# Patient Record
Sex: Female | Born: 1990
Health system: Southern US, Community
[De-identification: ages and names within clinical notes are randomized; demographics above are authoritative.]

## PROBLEM LIST (undated history)

## (undated) DIAGNOSIS — O009 Unspecified ectopic pregnancy without intrauterine pregnancy: Secondary | ICD-10-CM

## (undated) DIAGNOSIS — T7840XA Allergy, unspecified, initial encounter: Secondary | ICD-10-CM

## (undated) DIAGNOSIS — F32A Depression, unspecified: Secondary | ICD-10-CM

## (undated) DIAGNOSIS — F419 Anxiety disorder, unspecified: Secondary | ICD-10-CM

## (undated) DIAGNOSIS — A609 Anogenital herpesviral infection, unspecified: Secondary | ICD-10-CM

## (undated) DIAGNOSIS — N390 Urinary tract infection, site not specified: Secondary | ICD-10-CM

## (undated) DIAGNOSIS — F329 Major depressive disorder, single episode, unspecified: Secondary | ICD-10-CM

## (undated) HISTORY — DX: Anogenital herpesviral infection, unspecified: A60.9

## (undated) HISTORY — DX: Major depressive disorder, single episode, unspecified: F32.9

## (undated) HISTORY — PX: EXTERNAL EAR SURGERY: SHX627

## (undated) HISTORY — DX: Allergy, unspecified, initial encounter: T78.40XA

## (undated) HISTORY — DX: Anxiety disorder, unspecified: F41.9

## (undated) HISTORY — PX: NO PAST SURGERIES: SHX2092

## (undated) HISTORY — DX: Depression, unspecified: F32.A

---

## 2012-05-07 ENCOUNTER — Encounter (HOSPITAL_COMMUNITY): Payer: Self-pay

## 2012-05-07 ENCOUNTER — Inpatient Hospital Stay (HOSPITAL_COMMUNITY)
Admission: AD | Admit: 2012-05-07 | Discharge: 2012-05-07 | Disposition: A | Discharge status: Home / Self Care | Payer: BC Managed Care – PPO | Source: Ambulatory Visit | Attending: Obstetrics and Gynecology | Admitting: Obstetrics and Gynecology

## 2012-05-07 DIAGNOSIS — O00109 Unspecified tubal pregnancy without intrauterine pregnancy: Secondary | ICD-10-CM | POA: Insufficient documentation

## 2012-05-07 DIAGNOSIS — O009 Unspecified ectopic pregnancy without intrauterine pregnancy: Secondary | ICD-10-CM

## 2012-05-07 LAB — COMPREHENSIVE METABOLIC PANEL
Alkaline Phosphatase: 64 U/L (ref 39–117)
BUN: 12 mg/dL (ref 6–23)
Chloride: 102 mEq/L (ref 96–112)
Creatinine, Ser: 0.72 mg/dL (ref 0.50–1.10)
GFR calc Af Amer: >90 mL/min (ref >90)
Glucose, Bld: 88 mg/dL (ref 70–99)
Potassium: 4.2 mEq/L (ref 3.5–5.1)
Total Bilirubin: 0.8 mg/dL (ref 0.3–1.2)
Total Protein: 7.1 g/dL (ref 6.0–8.3)

## 2012-05-07 LAB — CBC
HCT: 43.5 % (ref 36.0–46.0)
Hemoglobin: 14.9 g/dL (ref 12.0–15.0)
MCHC: 34.3 g/dL (ref 30.0–36.0)
MCV: 86.8 fL (ref 78.0–100.0)

## 2012-05-07 LAB — ABO/RH: ABO/RH(D): O POS

## 2012-05-07 LAB — HCG, QUANTITATIVE, PREGNANCY: hCG, Beta Chain, Quant, S: 1557 m[IU]/mL — ABNORMAL HIGH (ref <5)

## 2012-05-07 MED ORDER — METHOTREXATE INJECTION FOR WOMEN'S HOSPITAL
50.0000 mg/m2 | Freq: Once | INTRAMUSCULAR | Status: AC
  Administered 2012-05-07: 90 mg via INTRAMUSCULAR
  Filled 2012-05-07: qty 1.8

## 2012-05-07 NOTE — MAU Note (Signed)
Pt states sent from MD office for L sided ectopic pregnancy. Spotting at present, denies pain. LMP-03/03/2012

## 2012-05-07 NOTE — Discharge Instructions (Signed)
Ectopic Pregnancy An ectopic pregnancy is when the fertilized egg attaches (implants) outside the uterus. Most ectopic pregnancies occur in the fallopian tube. Rarely do ectopic pregnancies occur on the ovary, intestine, pelvis, or cervix. An ectopic pregnancy does not have the ability to develop into a normal, healthy baby.  A ruptured ectopic pregnancy is one in which the fallopian tube gets torn or bursts and results in internal bleeding. Often there is intense abdominal pain, and sometimes, vaginal bleeding. Having an ectopic pregnancy can be a life-threatening experience. If left untreated, this dangerous condition can lead to a blood transfusion, abdominal surgery, or even death. CAUSES  Damage to the fallopian tubes is the suspected cause in most ectopic pregnancies.  RISK FACTORS Depending on your circumstances, the amount of risk of having an ectopic pregnancy will vary. There are 3 categories that may help you identify whether you are potentially at risk. High Risk  You have gone through infertility treatment.   You have had a previous ectopic pregnancy.   You have had previous tubal surgery.   You have had previous surgery to have the fallopian tubes tied (tubal ligation).   You have tubal problems or diseases.   You have been exposed to DES. DES is a medicine that was used until 1971 and had effects on babies whose mothers took the medicine.   You become pregnant while using an intrauterine device (IUD) for birth control.  Moderate Risk  You have a history of infertility.   You have a history of a sexually transmitted infection (STI).   You have a history of pelvic inflammatory disease (PID).   You have scarring from endometriosis.   You have multiple sexual partners.   You smoke.  Low Risk  You have had previous pelvic surgery.   You use vaginal douching.   You became sexually active before 22 years of age.  SYMPTOMS  An ectopic pregnancy should be suspected  in anyone who has missed a period and has abdominal pain or bleeding.  You may experience normal pregnancy symptoms, such as:   Nausea.   Tiredness.   Breast tenderness.   Symptoms that are not normal include:   Pain with intercourse.   Irregular vaginal bleeding or spotting.   Cramping or pain on one side, or in the lower abdomen.   Fast heartbeat.   Passing out while having a bowel movement.   Symptoms of a ruptured ectopic pregnancy and internal bleeding may include:   Sudden, severe pain in the abdomen and pelvis.   Dizziness or fainting.   Pain in the shoulder area.  DIAGNOSIS  Tests that may be performed include:  A pregnancy test.   An ultrasound.   Testing the specific level of pregnancy hormone in the bloodstream.   Taking a sample of uterus tissue (dilation and curettage, D&C).   Surgery to perform a visual exam of the inside of the abdomen using a lighted tube (laparoscopy).  TREATMENT  An injection of methotrexate medicine may be given. This is given if:  The diagnosis is made early.   The fallopian tube has not ruptured.   You are considered to be a good candidate for the medicine.  Usually, pregnancy hormone blood levels are checked after methotrexate treatment. This is to be sure the medicine is effective. It may take 4 to 6 weeks for the pregnancy to be absorbed (though most pregnancies will be absorbed by 3 weeks). Surgical treatment may be needed. A lighted tube (laparoscope) is  used to remove the tubal pregnancy. If severe internal bleeding occurs, a cut (incision) may be made in the lower abdomen (laparotomy), and the ectopic pregnancy is removed. This stops the bleeding. Part of the fallopian tube, or the whole tube, may be removed as well (salpingectomy). After surgery, pregnancy hormone tests may be done to be sure there is no pregnancy tissue left. You may receive a RhoGAM shot if you are Rh negative and the father is Rh positive, or if you do  not know the Rh type of the father. This is to prevent problems with any future pregnancy. SEEK IMMEDIATE MEDICAL CARE IF:  You have any symptoms of an ectopic pregnancy. This is a medical emergency. Document Released: 12/07/2004 Document Revised: 10/19/2011 Document Reviewed: 07/06/2011 [facility] Patient Information 2012 [place], [place].  Methotrexate Treatment for an Ectopic Pregnancy An ectopic pregnancy is when the fertilized egg attaches (implants) outside the uterus. Most ectopic pregnancies occur in the fallopian tube. Rarely do ectopic pregnancies occur on the ovary, intestine, pelvis, or cervix. An ectopic pregnancy does not have the ability to develop into a normal, healthy baby. Having an ectopic pregnancy can be a life-threatening experience. However, if the ectopic pregnancy is found early enough, it can be treated with a medicine. This medicine is called methotrexate. Methotrexate works by stopping the pregnancy from growing. It helps the body absorb the pregnancy tissue over a 2 to 6 week period (though most pregnancies will be absorbed by 3 weeks).  If methotrexate is successful, there is a good chance that the fallopian tube may be saved. Regardless of whether the fallopian tube is saved, a mother who has had an ectopic pregnancy is at a much higher risk of having another ectopic occur in future pregnancies. One serious concern is the potential for the fallopian tube to tear (rupture). If it does, emergency surgery is needed to remove the pregnancy, and methotrexate cannot be used. The ideal patient for methotrexate is a person who is:   Not bleeding internally.   Has no severe or persistent abdominal pain.   Is committed to following through with lab tests and appointments until the ectopic has absorbed.   Is healthy and has normal liver and kidney functions on evaluation.  Methotrexate should not be given to women who:  Are breastfeeding.   Have a normal pregnancy  (intrauterine pregnancy).   Have liver, lung, or kidney disease.   Have blood problems.   Are allergic to methotrexate.   Have peptic ulcers.   Have an ectopic pregnancy larger than 1 inches (3.5 cm) or one that has fetal heartbeats. This is a rule that is followed most of the time (relative contraindication).  BEFORE THE TREATMENT Before giving the medicine:  Liver tests, kidney tests, and a complete blood test are performed.   Blood tests are performed to measure the pregnancy hormone levels and to determine the mother's blood type.   If the woman is Rh negative, and the father is Rh positive or his Rh type is not known, a RhoGAM shot is given.  TREATMENT  There are 2 methods that your caregiver may use to prescribe methotrexate. One method involves a single dose or injection of the medicine. Another method involves a series of doses. This method involves several injections.  AFTER THE TREATMENT Blood tests will be taken for several weeks to check the pregnancy hormone levels. The blood tests are performed until there is no more pregnancy hormone detected in the blood. There is still  a risk of the ectopic pregnancy rupturing while using the methotrexate. There are also side effects of methotrexate, which include:   Nausea and vomiting.   Mouth sores.   Diarrhea.   Rash.   Dizziness.   Increased abdominal pain.   Increased vaginal bleeding or spotting.   Pneumonia.   Failed treatment.   Hair loss. This is rare and reversible.  On very rare occasions, the medicine may affect your blood counts, liver, kidney, bone marrow, or hormone levels. If this happens, your caregiver will want to perform further evaluations. Document Released: 10/24/2001 Document Revised: 10/19/2011 Document Reviewed: 07/06/2011 [facility] Patient Information 2012 [place], [place].

## 2012-05-07 NOTE — MAU Provider Note (Signed)
Jacqueline Damron21 y.o.G1P0 @[redacted]w[redacted]d  by LMP Chief Complaint  Patient presents with  . Ectopic pregnancy    First Provider Initiated Contact with Patient 05/07/12 2052     SUBJECTIVE  HPI: HPI: Jacqueline Brown is a 21 y.o. year old G1P0 female at [redacted]w[redacted]d weeks gestation by LMP who was sent to MAU for suspected left ectopic pregnancy. She was seen in at [facility] today for spotting, pos UPT. US showed left adnexal mass, no IUP per Dr. Dareen Piano. Pt was sent to MAU for quant. Denies abd pain, passage of tissue.    No past medical history on file. No past surgical history on file. History   Social History  . Marital Status: Single    Spouse Name: N/A    Number of Children: N/A  . Years of Education: N/A   Occupational History  . Not on file.   Social History Main Topics  . Smoking status: Not on file  . Smokeless tobacco: Not on file  . Alcohol Use: Not on file  . Drug Use: Not on file  . Sexually Active: Not on file   Other Topics Concern  . Not on file   Social History Narrative  . No narrative on file   No current facility-administered medications on file prior to encounter.   No current outpatient prescriptions on file prior to encounter.   Allergies  Allergen Reactions  . Sulfa Antibiotics Hives    ROS: Pertinent items in HPI  OBJECTIVE Blood pressure 137/88, pulse 107, temperature 99.6 F (37.6 C), temperature source Oral, resp. rate 16, height 5\' 6"  (1.676 m), weight 67.189 kg (148 lb 2 oz), last menstrual period 03/03/2012, SpO2 100.00%.  GENERAL: Well-developed, well-nourished female in no acute distress.  HEENT: Normocephalic, good dentition HEART: normal rate RESP: normal effort ABDOMEN: Soft, nontender EXTREMITIES: Nontender, no edema NEURO: Alert and oriented SPECULUM EXAM: deferred   LAB RESULTS  Results for orders placed during the hospital encounter of 05/07/12 (from the past 24 hour(s))  HCG, QUANTITATIVE, PREGNANCY     Status: Abnormal   Collection Time   05/07/12  7:28 PM      Component Value Range   hCG, Beta Chain, Quant, S 1557 (*) <5 mIU/mL  CBC     Status: Normal   Collection Time   05/07/12  7:28 PM      Component Value Range   WBC 6.7  4.0 - 10.5 K/uL   RBC 5.01  3.87 - 5.11 MIL/uL   Hemoglobin 14.9  12.0 - 15.0 g/dL   HCT 19.1  47.8 - 29.5 %   MCV 86.8  78.0 - 100.0 fL   MCH 29.7  26.0 - 34.0 pg   MCHC 34.3  30.0 - 36.0 g/dL   RDW 62.1  30.8 - 65.7 %   Platelets 263  150 - 400 K/uL  COMPREHENSIVE METABOLIC PANEL     Status: Normal   Collection Time   05/07/12  7:28 PM      Component Value Range   Sodium 140  135 - 145 mEq/L   Potassium 4.2  3.5 - 5.1 mEq/L   Chloride 102  96 - 112 mEq/L   CO2 27  19 - 32 mEq/L   Glucose, Bld 88  70 - 99 mg/dL   BUN 12  6 - 23 mg/dL   Creatinine, Ser 8.46  0.50 - 1.10 mg/dL   Calcium 9.8  8.4 - 96.2 mg/dL   Total Protein 7.1  6.0 - 8.3  g/dL   Albumin 4.2  3.5 - 5.2 g/dL   AST 16  0 - 37 U/L   ALT 19  0 - 35 U/L   Alkaline Phosphatase 64  39 - 117 U/L   Total Bilirubin 0.8  0.3 - 1.2 mg/dL   GFR calc non Af Amer >90  >90 mL/min   GFR calc Af Amer >90  >90 mL/min  ABO/RH     Status: Normal   Collection Time   05/07/12  7:28 PM      Component Value Range   ABO/RH(D) O POS      IMAGING NA  MTX given per consult w/ Dr. Dareen Piano  ASSESSMENT  1. Ectopic pregnancy without intrauterine pregnancy     PLAN D/C home Ectopic precautions D/C folic acid-containing vitamins Follow-up Information    Follow up with [facility] on 05/10/2012. (for bloodwork or as needed if symptoms worsen)    Contact information:   [address] [place] [address] [phone]       and weekly until [place], [place] 05/07/2012 9:05 PM

## 2012-05-07 NOTE — MAU Note (Signed)
Dr. Dareen Piano notified pt in MAU for ectopic pregnancy eval, denies pain, does have spotting, was told she has l sided ectopic. Dr. Dareen Piano states unsure if pt has ectopic pregnancy or not. Bhcg ordered. Will call MD office with results.

## 2012-05-10 ENCOUNTER — Encounter (HOSPITAL_COMMUNITY): Payer: Self-pay

## 2012-05-10 ENCOUNTER — Inpatient Hospital Stay (HOSPITAL_COMMUNITY)
Admission: AD | Admit: 2012-05-10 | Discharge: 2012-05-10 | Disposition: A | Discharge status: Home / Self Care | Payer: BC Managed Care – PPO | Source: Ambulatory Visit | Attending: Obstetrics & Gynecology | Admitting: Obstetrics & Gynecology

## 2012-05-10 DIAGNOSIS — O00109 Unspecified tubal pregnancy without intrauterine pregnancy: Secondary | ICD-10-CM | POA: Insufficient documentation

## 2012-05-10 DIAGNOSIS — O009 Unspecified ectopic pregnancy without intrauterine pregnancy: Secondary | ICD-10-CM

## 2012-05-10 LAB — HCG, QUANTITATIVE, PREGNANCY: hCG, Beta Chain, Quant, S: 1508 m[IU]/mL — ABNORMAL HIGH (ref <5)

## 2012-05-10 NOTE — Discharge Instructions (Signed)
Ectopic Pregnancy  An ectopic pregnancy happens when a fertilized egg grows outside the uterus. A pregnancy cannot live outside of the uterus. This problem often happens in the fallopian tube. It is often caused by damage to the fallopian tube.  If this problem is found early, you may be treated with medicine. If your tube tears or bursts open (ruptures), you will bleed inside. This is an emergency. You will need surgery. Get help right away.   SYMPTOMS  You may have normal pregnancy symptoms at first. These include:   Missing your period.   Feeling sick to your stomach (nauseous).   Being tired.   Having tender breasts.  Then, you may start to have symptoms that are not normal. These include:   Pain with sex (intercourse).   Bleeding from the vagina. This includes light bleeding (spotting).   Belly (abdomen) or lower belly cramping or pain. This may be felt on one side.   A fast heartbeat (pulse).   Passing out (fainting) after going poop (bowel movement).  If your tube tears, you may have symptoms such as:   Really bad pain in the belly or lower belly. This happens suddenly.   Dizziness.   Passing out.   Shoulder pain.  GET HELP RIGHT AWAY IF:   You have any symptoms that are not normal. This is an emergency.  Document Released: 01/26/2009 Document Revised: 10/19/2011 Document Reviewed: 07/06/2011  [employer] Patient Information 2012 [employer].

## 2012-05-10 NOTE — MAU Note (Signed)
Dr Adrian Blackwater in triage with patient to discus labs and plan of care. Will follow up on Monday.

## 2012-05-10 NOTE — MAU Provider Note (Signed)
  History     CSN: [id number]  Arrival date and time: 05/10/12 1830   None     Chief Complaint  Patient presents with  . Follow-up   HPI 21 yo G1P0 at 9.5 weeks who presents for Day 4 labs.   She has cramping, nausea, and malaise.  She denies bleeding, abdominal pain, vomiting.  OB History    Grav Para Term Preterm Abortions TAB SAB Ect Mult Living   1               No past medical history on file.  No past surgical history on file.  No family history on file.  History  Substance Use Topics  . Smoking status: Not on file  . Smokeless tobacco: Not on file  . Alcohol Use: Not on file    Allergies:  Allergies  Allergen Reactions  . Sulfa Antibiotics Hives    No prescriptions prior to admission    ROS Physical Exam   Blood pressure 137/81, pulse 99, temperature 98.6 F (37 C), temperature source Oral, resp. rate 16, last menstrual period 03/03/2012, SpO2 100.00%.  Physical Exam  Constitutional: She is oriented to person, place, and time. She appears well-developed and well-nourished.  Neurological: She is alert and oriented to person, place, and time.  Skin: Skin is warm and dry.  Psychiatric: She has a normal mood and affect. Her behavior is normal. Judgment and thought content normal.   Results for orders placed during the hospital encounter of 05/10/12 (from the past 24 hour(s))  HCG, QUANTITATIVE, PREGNANCY     Status: Abnormal   Collection Time   05/10/12  6:54 PM      Component Value Range   hCG, Beta Chain, Quant, S 1508 (*) <5 mIU/mL     MAU Course  Procedures   Assessment and Plan  1.  Ectopic Pregnancy  Discussed case with Dr Arlyce Dice.  Will have patient return on Day 7 for labs.  Warning signs and symptoms of ruptured ectopic pregnancy given.  Persis Graffius JEHIEL 05/10/2012, 8:23 PM

## 2012-05-10 NOTE — MAU Note (Signed)
Patient to MAU for day 4 BHCG s/p MTX. Patient states she has had a headache and cramping since the day after the injection. No bleeding at this time. Patient states she "just doesn't feel good"

## 2012-05-13 ENCOUNTER — Inpatient Hospital Stay (HOSPITAL_COMMUNITY)
Admission: AD | Admit: 2012-05-13 | Discharge: 2012-05-13 | Disposition: A | Discharge status: Home / Self Care | Payer: BC Managed Care – PPO | Source: Ambulatory Visit | Attending: Obstetrics and Gynecology | Admitting: Obstetrics and Gynecology

## 2012-05-13 ENCOUNTER — Encounter (HOSPITAL_COMMUNITY): Payer: Self-pay | Admitting: Family

## 2012-05-13 DIAGNOSIS — O00109 Unspecified tubal pregnancy without intrauterine pregnancy: Secondary | ICD-10-CM | POA: Insufficient documentation

## 2012-05-13 DIAGNOSIS — O009 Unspecified ectopic pregnancy without intrauterine pregnancy: Secondary | ICD-10-CM

## 2012-05-13 HISTORY — DX: Unspecified ectopic pregnancy without intrauterine pregnancy: O00.90

## 2012-05-13 LAB — CBC WITH DIFFERENTIAL/PLATELET
Basophils Absolute: 0.1 10*3/uL (ref 0.0–0.1)
HCT: 42.5 % (ref 36.0–46.0)
Hemoglobin: 14.4 g/dL (ref 12.0–15.0)
Lymphocytes Relative: 42 % (ref 12–46)
Lymphs Abs: 2.8 10*3/uL (ref 0.7–4.0)
Monocytes Absolute: 0.5 10*3/uL (ref 0.1–1.0)
Monocytes Relative: 7 % (ref 3–12)
Neutro Abs: 3.2 10*3/uL (ref 1.7–7.7)
RBC: 4.88 MIL/uL (ref 3.87–5.11)
WBC: 6.7 10*3/uL (ref 4.0–10.5)

## 2012-05-13 LAB — COMPREHENSIVE METABOLIC PANEL
BUN: 12 mg/dL (ref 6–23)
Calcium: 9.4 mg/dL (ref 8.4–10.5)
GFR calc Af Amer: >90 mL/min (ref >90)
Glucose, Bld: 86 mg/dL (ref 70–99)
Total Protein: 7.3 g/dL (ref 6.0–8.3)

## 2012-05-13 MED ORDER — METHOTREXATE INJECTION FOR WOMEN'S HOSPITAL
50.0000 mg/m2 | Freq: Once | INTRAMUSCULAR | Status: AC
  Administered 2012-05-13: 90 mg via INTRAMUSCULAR
  Filled 2012-05-13: qty 1.8

## 2012-05-13 MED ORDER — OXYCODONE-ACETAMINOPHEN 5-325 MG PO TABS: 1.0000 | ORAL_TABLET | ORAL | Status: DC | PRN

## 2012-05-13 NOTE — Discharge Instructions (Signed)
Ectopic Pregnancy An ectopic pregnancy is when the fertilized egg attaches (implants) outside the uterus. Most ectopic pregnancies occur in the fallopian tube. Rarely do ectopic pregnancies occur on the ovary, intestine, pelvis, or cervix. An ectopic pregnancy does not have the ability to develop into a normal, healthy baby.  A ruptured ectopic pregnancy is one in which the fallopian tube gets torn or bursts and results in internal bleeding. Often there is intense abdominal pain, and sometimes, vaginal bleeding. Having an ectopic pregnancy can be a life-threatening experience. If left untreated, this dangerous condition can lead to a blood transfusion, abdominal surgery, or even death. CAUSES  Damage to the fallopian tubes is the suspected cause in most ectopic pregnancies.  RISK FACTORS Depending on your circumstances, the amount of risk of having an ectopic pregnancy will vary. There are 3 categories that may help you identify whether you are potentially at risk. High Risk  You have gone through infertility treatment.   You have had a previous ectopic pregnancy.   You have had previous tubal surgery.   You have had previous surgery to have the fallopian tubes tied (tubal ligation).   You have tubal problems or diseases.   You have been exposed to DES. DES is a medicine that was used until 1971 and had effects on babies whose mothers took the medicine.   You become pregnant while using an intrauterine device (IUD) for birth control.  Moderate Risk  You have a history of infertility.   You have a history of a sexually transmitted infection (STI).   You have a history of pelvic inflammatory disease (PID).   You have scarring from endometriosis.   You have multiple sexual partners.   You smoke.  Low Risk  You have had previous pelvic surgery.   You use vaginal douching.   You became sexually active before 21 years of age.  SYMPTOMS  An ectopic pregnancy should be suspected  in anyone who has missed a period and has abdominal pain or bleeding.  You may experience normal pregnancy symptoms, such as:   Nausea.   Tiredness.   Breast tenderness.   Symptoms that are not normal include:   Pain with intercourse.   Irregular vaginal bleeding or spotting.   Cramping or pain on one side, or in the lower abdomen.   Fast heartbeat.   Passing out while having a bowel movement.   Symptoms of a ruptured ectopic pregnancy and internal bleeding may include:   Sudden, severe pain in the abdomen and pelvis.   Dizziness or fainting.   Pain in the shoulder area.  DIAGNOSIS  Tests that may be performed include:  A pregnancy test.   An ultrasound.   Testing the specific level of pregnancy hormone in the bloodstream.   Taking a sample of uterus tissue (dilation and curettage, D&C).   Surgery to perform a visual exam of the inside of the abdomen using a lighted tube (laparoscopy).  TREATMENT  An injection of methotrexate medicine may be given. This is given if:  The diagnosis is made early.   The fallopian tube has not ruptured.   You are considered to be a good candidate for the medicine.  Usually, pregnancy hormone blood levels are checked after methotrexate treatment. This is to be sure the medicine is effective. It may take 4 to 6 weeks for the pregnancy to be absorbed (though most pregnancies will be absorbed by 3 weeks). Surgical treatment may be needed. A lighted tube (laparoscope) is   used to remove the tubal pregnancy. If severe internal bleeding occurs, a cut (incision) may be made in the lower abdomen (laparotomy), and the ectopic pregnancy is removed. This stops the bleeding. Part of the fallopian tube, or the whole tube, may be removed as well (salpingectomy). After surgery, pregnancy hormone tests may be done to be sure there is no pregnancy tissue left. You may receive a RhoGAM shot if you are Rh negative and the father is Rh positive, or if you do  not know the Rh type of the father. This is to prevent problems with any future pregnancy. SEEK IMMEDIATE MEDICAL CARE IF:  You have any symptoms of an ectopic pregnancy. This is a medical emergency. Document Released: 12/07/2004 Document Revised: 10/19/2011 Document Reviewed: 07/06/2011 [employer] Patient Information 2012 [employer]. 

## 2012-05-13 NOTE — MAU Provider Note (Signed)
History     CSN: [id number]  Arrival date and time: 05/13/12 1814   None     Chief Complaint  Patient presents with  . Follow-up   HPI  Pt here for BHCG on day 7 after receiving MTX on 05/07/12.  Pt reports spotting, minimal pain.    Past Medical History  Diagnosis Date  . No pertinent past medical history     Past Surgical History  Procedure Date  . No past surgeries     Family History  Problem Relation Age of Onset  . Heart disease Father     History  Substance Use Topics  . Smoking status: Never Smoker   . Smokeless tobacco: Not on file  . Alcohol Use: Yes     socially     Allergies:  Allergies  Allergen Reactions  . Sulfa Antibiotics Hives    No prescriptions prior to admission    Review of Systems  Gastrointestinal: Positive for abdominal pain (cramping).  Genitourinary:       Spotting of blood  All other systems reviewed and are negative.    Physical Exam   Blood pressure 132/78, pulse 68, temperature 98.5 F (36.9 C), temperature source Oral, resp. rate 16, last menstrual period 03/03/2012, SpO2 100.00%.  Physical Exam  Constitutional: She is oriented to person, place, and time. She appears well-developed and well-nourished. No distress.  HENT:  Head: Normocephalic.  Neck: Normal range of motion. Neck supple.  Neurological: She is alert and oriented to person, place, and time. She has normal reflexes.  Skin: Skin is warm and dry.    MAU Course  Procedures  Results for Jacqueline, Brown (MRN [id number]) as of 05/13/2012 20:39  Ref. Range 05/07/2012 19:28 05/08/2012 02:09 05/10/2012 18:54 05/11/2012 02:10 05/13/2012 18:29  hCG, Beta Chain, Quant, S Latest Range: <5 mIU/mL 1557 (H)  1508 (H)  1232 (H)   Only 21% drop in BHCG   Consulted with Dr. Dareen Piano, since less than 25% drop repeat MTX  CMP and CBC ordered  Report given to M. Mayford Knife who assumes care of patient.  [facility]   Assessment and Plan   Results for orders placed  during the hospital encounter of 05/13/12 (from the past 24 hour(s))  HCG, QUANTITATIVE, PREGNANCY     Status: Abnormal   Collection Time   05/13/12  6:29 PM      Component Value Range   hCG, Beta Chain, Quant, S 1232 (*) <5 mIU/mL  COMPREHENSIVE METABOLIC PANEL     Status: Normal   Collection Time   05/13/12  6:29 PM      Component Value Range   Sodium 136  135 - 145 mEq/L   Potassium 3.9  3.5 - 5.1 mEq/L   Chloride 97  96 - 112 mEq/L   CO2 25  19 - 32 mEq/L   Glucose, Bld 86  70 - 99 mg/dL   BUN 12  6 - 23 mg/dL   Creatinine, Ser 0.86  0.50 - 1.10 mg/dL   Calcium 9.4  8.4 - 57.8 mg/dL   Total Protein 7.3  6.0 - 8.3 g/dL   Albumin 4.2  3.5 - 5.2 g/dL   AST 18  0 - 37 U/L   ALT 19  0 - 35 U/L   Alkaline Phosphatase 60  39 - 117 U/L   Total Bilirubin 1.0  0.3 - 1.2 mg/dL   GFR calc non Af Amer >90  >90 mL/min   GFR calc Af Amer >90  >  90 mL/min  CBC WITH DIFFERENTIAL     Status: Normal   Collection Time   05/13/12  6:29 PM      Component Value Range   WBC 6.7  4.0 - 10.5 K/uL   RBC 4.88  3.87 - 5.11 MIL/uL   Hemoglobin 14.4  12.0 - 15.0 g/dL   HCT 40.9  81.1 - 91.4 %   MCV 87.1  78.0 - 100.0 fL   MCH 29.5  26.0 - 34.0 pg   MCHC 33.9  30.0 - 36.0 g/dL   RDW 78.2  95.6 - 21.3 %   Platelets 266  150 - 400 K/uL   Neutrophils Relative 48  43 - 77 %   Neutro Abs 3.2  1.7 - 7.7 K/uL   Lymphocytes Relative 42  12 - 46 %   Lymphs Abs 2.8  0.7 - 4.0 K/uL   Monocytes Relative 7  3 - 12 %   Monocytes Absolute 0.5  0.1 - 1.0 K/uL   Eosinophils Relative 2  0 - 5 %   Eosinophils Absolute 0.1  0.0 - 0.7 K/uL   Basophils Relative 1  0 - 1 %   Basophils Absolute 0.1  0.0 - 0.1 K/uL    Methotrexate ordered according to protocol. Pt discharged home. Plan repeat quant

## 2012-05-13 NOTE — MAU Note (Signed)
Labs discussed with pt per [facility].

## 2012-05-13 NOTE — MAU Note (Signed)
Patient to MAU for day 7 BHCG s/p MTX injection. Patient states she is having a little spotting, cramping and a headache that she has had since getting the injection.

## 2012-05-13 NOTE — MAU Note (Signed)
At 2235, call to pharmacy to check on status of MTX and pharmacy states there is no order. Pt was notified of labs and need for MTX at [facility]. Williams,CNM notified of same.

## 2012-05-15 ENCOUNTER — Observation Stay (HOSPITAL_COMMUNITY)
Admission: AD | Admit: 2012-05-15 | Discharge: 2012-05-16 | Disposition: A | Discharge status: Home / Self Care | Payer: BC Managed Care – PPO | Source: Ambulatory Visit | Attending: Obstetrics & Gynecology | Admitting: Obstetrics & Gynecology

## 2012-05-15 ENCOUNTER — Encounter (HOSPITAL_COMMUNITY): Payer: Self-pay | Admitting: Family

## 2012-05-15 ENCOUNTER — Inpatient Hospital Stay (HOSPITAL_COMMUNITY): Payer: BC Managed Care – PPO

## 2012-05-15 DIAGNOSIS — O00109 Unspecified tubal pregnancy without intrauterine pregnancy: Principal | ICD-10-CM | POA: Insufficient documentation

## 2012-05-15 DIAGNOSIS — O009 Unspecified ectopic pregnancy without intrauterine pregnancy: Secondary | ICD-10-CM

## 2012-05-15 DIAGNOSIS — N939 Abnormal uterine and vaginal bleeding, unspecified: Secondary | ICD-10-CM

## 2012-05-15 DIAGNOSIS — R102 Pelvic and perineal pain: Secondary | ICD-10-CM

## 2012-05-15 LAB — CBC
Hemoglobin: 13.3 g/dL (ref 12.0–15.0)
MCHC: 34 g/dL (ref 30.0–36.0)
RBC: 4.46 MIL/uL (ref 3.87–5.11)

## 2012-05-15 LAB — TYPE AND SCREEN: Antibody Screen: NEGATIVE

## 2012-05-15 MED ORDER — SODIUM CHLORIDE 0.9 % IJ SOLN: 3.0000 mL | Freq: Two times a day (BID) | INTRAMUSCULAR | Status: DC

## 2012-05-15 MED ORDER — IBUPROFEN 600 MG PO TABS
600.0000 mg | ORAL_TABLET | Freq: Four times a day (QID) | ORAL | Status: DC | PRN
  Administered 2012-05-16: 600 mg via ORAL
  Filled 2012-05-15: qty 1

## 2012-05-15 MED ORDER — HYDROCODONE-ACETAMINOPHEN 5-325 MG PO TABS
1.0000 | ORAL_TABLET | Freq: Once | ORAL | Status: AC
  Administered 2012-05-15: 1 via ORAL
  Filled 2012-05-15: qty 1

## 2012-05-15 MED ORDER — PRENATAL MULTIVITAMIN CH
1.0000 | ORAL_TABLET | Freq: Every day | ORAL | Status: DC
  Filled 2012-05-15: qty 1

## 2012-05-15 MED ORDER — SODIUM CHLORIDE 0.9 % IJ SOLN: 3.0000 mL | INTRAMUSCULAR | Status: DC | PRN

## 2012-05-15 MED ORDER — HYDROCODONE-ACETAMINOPHEN 5-325 MG PO TABS
1.0000 | ORAL_TABLET | ORAL | Status: DC | PRN
  Administered 2012-05-16 (×2): 2 via ORAL
  Filled 2012-05-15 (×2): qty 2

## 2012-05-15 MED ORDER — SODIUM CHLORIDE 0.9 % IV SOLN
250.0000 mL | INTRAVENOUS | Status: DC | PRN
  Administered 2012-05-15: 250 mL via INTRAVENOUS

## 2012-05-15 NOTE — MAU Note (Signed)
Patient has known left ectopic pregnancy and has had MTX x 2, last dose on 7-1. Had sudden onset of lower abdominal pain that radiated to the back. No bleeding. States pain continues. Arrived by EMS with a 18 angio in the L antecubital.No bleeding

## 2012-05-15 NOTE — H&P (Signed)
Ms. Jacqueline Brown is a 21 year old, G1P0, patient of Dr. Delray Alt who is currently undergoing MTX therapy for an ectopic pregnancy. Pt received her first dose on 05/07/12. At the time her [facility] was 1,557. She had a left adnexal mass approximately 2cm.  On 6/28 her Quant was 1,508. On 7/1 her Quant was 1,232.  Because the drop was not enough she received a second dose of MTX on 7/1.  She states that she had acute onset of LAP tonight and called EMS. When she arrived she had a follow up ultrasound which revealed a 2.9cm Left adnexal mass and a 1.0 cm area c/w a clot.  Her HGB was stable from two days ago and her Sharene Butters was now 45.  PE: VSSAF        ABD- soft, some guarding in both lower quads, no rebound, no masses.  IMP/  Pt is not in acute distress or pain. Her HGB is stable, the Quant BHCG is decreasing and the mass is stable.          I do not feel that the patient needs surgery at this time. Plan/ Will admit until the am. Repeat the CBC in the am and if stable will discharge.

## 2012-05-15 NOTE — MAU Provider Note (Signed)
History     CSN: [id number]  Arrival date and time: 05/15/12 1834   None     Chief Complaint  Patient presents with  . Abdominal Pain   HPI Jacqueline Brown is 21 y.o. G1P0 [redacted]w[redacted]d weeks presenting with sudden onset of abdominal pain while sitting. Patient of  Dr. Claiborne Billings.   She has a known left ectopic pregnancy that has been treated with 2 doses of MTX.   Has had nausea, treated with Phenergan.  Denies vaginal bleeding today.  Brought in to MAU by EMS.    Past Medical History  Diagnosis Date  . No pertinent past medical history     Past Surgical History  Procedure Date  . No past surgeries     Family History  Problem Relation Age of Onset  . Heart disease Father     History  Substance Use Topics  . Smoking status: Never Smoker   . Smokeless tobacco: Not on file  . Alcohol Use: Yes     socially     Allergies:  Allergies  Allergen Reactions  . Sulfa Antibiotics Hives    Prescriptions prior to admission  Medication Sig Dispense Refill  . diphenhydrAMINE (SOMINEX) 25 MG tablet Take 25 mg by mouth at bedtime as needed. For rash/sleep      . oxyCODONE-acetaminophen (ROXICET) 5-325 MG per tablet Take 1 tablet by mouth every 4 (four) hours as needed for pain.  30 tablet  0    Review of Systems  Constitutional: Negative for fever and chills.  Gastrointestinal: Positive for nausea and abdominal pain.  Genitourinary:       Negative for vaginal bleeding   Physical Exam   Blood pressure 119/62, pulse 83, temperature 98.1 F (36.7 C), temperature source Oral, resp. rate 20, height 5\' 6"  (1.676 m), weight 67.132 kg (148 lb), last menstrual period 03/03/2012, SpO2 100.00%.  Physical Exam  Constitutional: She is oriented to person, place, and time. She appears well-developed and well-nourished. No distress.       smiling  HENT:  Head: Normocephalic.  Neck: Normal range of motion.  Cardiovascular: Normal rate.   Respiratory: Effort normal.  Neurological: She is alert  and oriented to person, place, and time.  Skin: Skin is warm and dry.  Psychiatric: She has a normal mood and affect. Her behavior is normal. Thought content normal.   Results for orders placed during the hospital encounter of 05/15/12 (from the past 24 hour(s))  HCG, QUANTITATIVE, PREGNANCY     Status: Abnormal   Collection Time   05/15/12  7:23 PM      Component Value Range   hCG, Beta Chain, Quant, S 767 (*) <5 mIU/mL   MAU Course  Procedures  MDM Orders for ultrasound and BHCG in.  Dr. Dareen Piano is aware of patient's admission to MAU. 20:00 Care turned over to T. Marvetta Gibbons, NP  2015 - Client came due to pain all across lower abdomen and in low back.  Worse than when she was here earlier having quants checked.  Describes as 6/10 currently.  Was 10/10 when she arrived.  Asking for pain medication.  Exam.  Tender to palpation in RLQ and LLQ with L>R.  Mild guarding noted.  Having some vaginal bleeding now and was not having bleeding previously.  2030  Dr. Dareen Piano notified of ultrasound results.  2120  Dr. Dareen Piano in to see client.  Discussed plan.  Will admit tonight.  Reviewed HGB results.  Assessment and Plan  Ectopic pregnancy Abdominal  and back pain Vaginal bleeding  Plan Admit and observe  KEY,EVE M 05/15/2012, 7:19 PM

## 2012-05-16 LAB — CBC
HCT: 37 % (ref 36.0–46.0)
Hemoglobin: 12.3 g/dL (ref 12.0–15.0)
MCH: 29.2 pg (ref 26.0–34.0)
MCHC: 33.2 g/dL (ref 30.0–36.0)
MCV: 87.9 fL (ref 78.0–100.0)
Platelets: 208 10*3/uL (ref 150–400)
RBC: 4.21 MIL/uL (ref 3.87–5.11)
RDW: 13 % (ref 11.5–15.5)
WBC: 5.7 10*3/uL (ref 4.0–10.5)

## 2012-05-16 MED ORDER — HYDROCODONE-ACETAMINOPHEN 5-325 MG PO TABS: 1.0000 | ORAL_TABLET | ORAL | Status: DC | PRN

## 2012-05-16 NOTE — Progress Notes (Signed)
Patient is eating, ambulating, and voiding.  Pain is much better today. HGB is stable.    BP 94/57  Pulse 63  Temp 97.7 F (36.5 C) (Oral)  Resp 18  Ht 5\' 6"  (1.676 m)  Wt 67.132 kg (148 lb)  BMI 23.89 kg/m2  SpO2 99%  LMP 03/03/2012  lungs:   clear to auscultation cor:    RRR Abdomen:  soft, appropriate tenderness, incisions intact and without erythema or exudate. ex:    no cords   Lab Results  Component Value Date   WBC 5.7 05/16/2012   HGB 12.3 05/16/2012   HCT 37.0 05/16/2012   MCV 87.9 05/16/2012   PLT 208 05/16/2012    A/P  Ectopic, S/p methotrexate, second dose on 07-01.  Expect d/c per plan.  Recheck HCG tomorrow.  Vicodin for pain. Nancye Grumbine A

## 2012-05-16 NOTE — Progress Notes (Signed)
UR Chart review completed.  

## 2012-05-16 NOTE — Progress Notes (Signed)
Pt discharged to home with friend.  Condition stable.  Pt ambulated to car with E. Pinion, NT.  No equipment for home ordered at discharge. 

## 2012-05-16 NOTE — Discharge Summary (Signed)
Physician Discharge Summary  Patient ID: Jacqueline Brown MRN: [id number] DOB/AGE: June 13, 1991 20 y.o.  Admit date: 05/15/2012 Discharge date: 05/16/2012  Admission Diagnoses:ectopic, increased pain  Discharge Diagnoses: same, stable post methotrexate Active Problems:  * No active hospital problems. *    Discharged Condition: good  Hospital Course: Hgb stable  Consults: None  Significant Diagnostic Studies: labs:  CBC    Component Value Date/Time   WBC 5.7 05/16/2012 0605   RBC 4.21 05/16/2012 0605   HGB 12.3 05/16/2012 0605   HCT 37.0 05/16/2012 0605   PLT 208 05/16/2012 0605   MCV 87.9 05/16/2012 0605   MCH 29.2 05/16/2012 0605   MCHC 33.2 05/16/2012 0605   RDW 13.0 05/16/2012 0605   LYMPHSABS 2.8 05/13/2012 1829   MONOABS 0.5 05/13/2012 1829   EOSABS 0.1 05/13/2012 1829   BASOSABS 0.1 05/13/2012 1829      Treatments: IV hydration   Disposition: 01-Home or Self Care   Medication List  As of 05/16/2012  9:21 AM   STOP taking these medications         oxyCODONE-acetaminophen 5-325 MG per tablet         TAKE these medications         diphenhydrAMINE 25 MG tablet   Commonly known as: SOMINEX   Take 25 mg by mouth at bedtime as needed. For rash/sleep      HYDROcodone-acetaminophen 5-325 MG per tablet   Commonly known as: NORCO   Take 1-2 tablets by mouth every 4 (four) hours as needed.           Follow-up Information    Follow up with [facility]. (for repeat hcg)    Contact information:   [address] [place] [address]          Signed: Loney Laurence 05/16/2012, 9:21 AM

## 2012-05-17 ENCOUNTER — Inpatient Hospital Stay (HOSPITAL_COMMUNITY)
Admission: AD | Admit: 2012-05-17 | Discharge: 2012-05-17 | Disposition: A | Discharge status: Home / Self Care | Payer: BC Managed Care – PPO | Source: Ambulatory Visit | Attending: Obstetrics and Gynecology | Admitting: Obstetrics and Gynecology

## 2012-05-17 DIAGNOSIS — O00109 Unspecified tubal pregnancy without intrauterine pregnancy: Secondary | ICD-10-CM | POA: Insufficient documentation

## 2012-05-17 DIAGNOSIS — Z09 Encounter for follow-up examination after completed treatment for conditions other than malignant neoplasm: Secondary | ICD-10-CM

## 2012-05-17 LAB — HCG, QUANTITATIVE, PREGNANCY: hCG, Beta Chain, Quant, S: 294 m[IU]/mL — ABNORMAL HIGH (ref <5)

## 2012-05-17 NOTE — MAU Provider Note (Signed)
Jacqueline Brown is a 21 y.o. female who presents to MAU for follow up Bhcg s/p second MTX for left ectopic pregnancy. On 05/10/12 the bhcg was 1503, then on 05/13/12 dropped to 1232. On 7/3 it was 767 and today has dropped to 294. Patient with minimal pain today.   BP 114/75  Pulse 92  Temp 98.5 F (36.9 C) (Oral)  Resp 16  SpO2 100%  LMP 03/03/2012   I discussed the results with Dr. Henderson Cloud.   Patient will return on Monday 05/20/12 for Bhcg. She will continue ectopic precautions.

## 2012-05-17 NOTE — MAU Note (Signed)
Patient to MAU for day #4 BHCG s/p 2nd MTX injection. Patient state she continues to have a little pain but no bleeding.

## 2012-05-20 ENCOUNTER — Inpatient Hospital Stay (HOSPITAL_COMMUNITY)
Admission: AD | Admit: 2012-05-20 | Discharge: 2012-05-20 | Disposition: A | Discharge status: Hospice | Payer: BC Managed Care – PPO | Source: Ambulatory Visit | Attending: Obstetrics and Gynecology | Admitting: Obstetrics and Gynecology

## 2012-05-20 ENCOUNTER — Encounter (HOSPITAL_COMMUNITY): Payer: Self-pay | Admitting: Family

## 2012-05-20 DIAGNOSIS — O00109 Unspecified tubal pregnancy without intrauterine pregnancy: Secondary | ICD-10-CM | POA: Insufficient documentation

## 2012-05-20 DIAGNOSIS — O009 Unspecified ectopic pregnancy without intrauterine pregnancy: Secondary | ICD-10-CM

## 2012-05-20 LAB — HCG, QUANTITATIVE, PREGNANCY: hCG, Beta Chain, Quant, S: 57 m[IU]/mL — ABNORMAL HIGH (ref <5)

## 2012-05-20 NOTE — MAU Note (Signed)
Pt here for f/u BHCG.  Having mild lower left abd pain that she rates a "3" and says she took 1 percocet today for the pain that helped.  Red spotting when she wipes.

## 2012-05-20 NOTE — MAU Provider Note (Signed)
History     CSN: [id number]  Arrival date and time: 05/20/12 1901   None     No chief complaint on file.  HPI  Jacqueline Brown is a 21 year old, G1P0, patient of Dr. Delray Alt who is currently undergoing MTX therapy for an ectopic pregnancy. Pt received her first dose on 05/07/12. At the time her [facility] was 1,557. She had a left adnexal mass approximately 2cm. On 6/28 her Quant was 1,508. On 7/1 her Quant was 1,232. Because the drop was not enough she received a second dose of MTX on 7/1. Pt arrived EMS on 05/15/12 and on follow up ultrasound found a 2.9cm Left adnexal mass and a 1.0 cm area c/w a clot. Her HGB was stable from two days ago and her Sharene Butters was 22. Opted to not do surgery and just repeat BHCG.  BHCG has been the following:    Results for JONAE, RENSHAW (MRN [id number]) as of 05/20/2012 19:55  Ref. Range 05/13/2012 18:29 05/14/2012 02:11 05/15/2012 19:23 05/15/2012 19:51 05/15/2012 20:00 05/16/2012 06:05 05/17/2012 02:10 05/17/2012 13:35 05/18/2012 02:10 05/20/2012 19:37  hCG, Beta Chain, Quant, S Latest Range: <5 mIU/mL 1232 (H)  767 (H)     294 (H)  57 (H)   Pt reports some cramping and scant amount of bleeding.     Past Medical History  Diagnosis Date  . No pertinent past medical history     Past Surgical History  Procedure Date  . No past surgeries     Family History  Problem Relation Age of Onset  . Heart disease Father     History  Substance Use Topics  . Smoking status: Never Smoker   . Smokeless tobacco: Not on file  . Alcohol Use: Yes     socially     Allergies:  Allergies  Allergen Reactions  . Sulfa Antibiotics Hives    Prescriptions prior to admission  Medication Sig Dispense Refill  . diphenhydrAMINE (BENADRYL) 25 MG tablet Take 25 mg by mouth every 6 (six) hours as needed. For allergies, rash or sleep      . HYDROcodone-acetaminophen (NORCO) 5-325 MG per tablet Take 1-2 tablets by mouth every 4 (four) hours as needed. For pain        Review of Systems    Gastrointestinal: Positive for abdominal pain (cramping).  Genitourinary:       Scant bleeding  All other systems reviewed and are negative.   Physical Exam   Blood pressure 115/74, pulse 82, temperature 98.9 F (37.2 C), temperature source Oral, resp. rate 16, last menstrual period 03/03/2012.  Physical Exam  Constitutional: She is oriented to person, place, and time. She appears well-developed and well-nourished. No distress.  HENT:  Head: Normocephalic.  Neck: Normal range of motion. Neck supple.  GI: Soft. There is no tenderness.  Musculoskeletal: Normal range of motion.  Neurological: She is alert and oriented to person, place, and time. She has normal reflexes.  Skin: Skin is warm and dry.    MAU Course  Procedures Results for orders placed during the hospital encounter of 05/20/12 (from the past 24 hour(s))  HCG, QUANTITATIVE, PREGNANCY     Status: Abnormal   Collection Time   05/20/12  7:37 PM      Component Value Range   hCG, Beta Chain, Quant, S 57 (*) <5 mIU/mL     Assessment and Plan  Ectopic Pregnancy s/p Methotrexate  Plan: DC to home Ectopic precautions Return for BHCG in one week  [facility]  05/20/2012, 8:12 PM

## 2012-05-27 ENCOUNTER — Encounter (HOSPITAL_COMMUNITY): Payer: Self-pay | Admitting: *Deleted

## 2012-05-27 ENCOUNTER — Inpatient Hospital Stay (HOSPITAL_COMMUNITY)
Admission: AD | Admit: 2012-05-27 | Discharge: 2012-05-27 | Disposition: A | Discharge status: Home / Self Care | Payer: BC Managed Care – PPO | Source: Ambulatory Visit | Attending: Obstetrics & Gynecology | Admitting: Obstetrics & Gynecology

## 2012-05-27 DIAGNOSIS — O00109 Unspecified tubal pregnancy without intrauterine pregnancy: Secondary | ICD-10-CM | POA: Insufficient documentation

## 2012-05-27 DIAGNOSIS — Z09 Encounter for follow-up examination after completed treatment for conditions other than malignant neoplasm: Secondary | ICD-10-CM

## 2012-05-27 HISTORY — DX: Unspecified ectopic pregnancy without intrauterine pregnancy: O00.90

## 2012-05-27 HISTORY — DX: Urinary tract infection, site not specified: N39.0

## 2012-05-27 NOTE — MAU Note (Signed)
Follow up post methotrexate, received 2 doses.

## 2012-05-27 NOTE — MAU Provider Note (Signed)
Jacqueline Brown is a 21 y.o. female who presents to MAU for follow up Bhcg s/p second MTX. The number has been dropping with each visit. The patient has no pain.  BP 119/68  Pulse 69  Temp 98.6 F (37 C) (Oral)  Resp 18  LMP 03/03/2012   Results for orders placed during the hospital encounter of 05/27/12 (from the past 24 hour(s))  HCG, QUANTITATIVE, PREGNANCY     Status: Normal   Collection Time   05/27/12  6:47 PM      Component Value Range   hCG, Beta Chain, Quant, S 4  <5 mIU/mL   I spoke with Dr. Arlyce Dice and the patient can follow up in 2 weeks in the office. I discussed the results of the Bhcg with the patient and plan of care and she voices understanding.

## 2013-12-23 ENCOUNTER — Ambulatory Visit (INDEPENDENT_AMBULATORY_CARE_PROVIDER_SITE_OTHER): Payer: 59 | Admitting: Physician Assistant

## 2013-12-23 VITALS — BP 124/76 | HR 98 | Temp 98.0°F | Resp 17 | Ht 66.5 in | Wt 164.0 lb

## 2013-12-23 DIAGNOSIS — N390 Urinary tract infection, site not specified: Secondary | ICD-10-CM

## 2013-12-23 DIAGNOSIS — R300 Dysuria: Secondary | ICD-10-CM

## 2013-12-23 LAB — POCT CBC
Granulocyte percent: 48.3 %G (ref 37–80)
HEMATOCRIT: 46.4 % (ref 37.7–47.9)
Hemoglobin: 14.9 g/dL (ref 12.2–16.2)
LYMPH, POC: 3.4 (ref 0.6–3.4)
MCH, POC: 30.8 pg (ref 27–31.2)
MCHC: 32.1 g/dL (ref 31.8–35.4)
MCV: 95.9 fL (ref 80–97)
MID (CBC): 0.4 (ref 0–0.9)
MPV: 8.1 fL (ref 0–99.8)
PLATELET COUNT, POC: 315 10*3/uL (ref 142–424)
POC GRANULOCYTE: 3.6 (ref 2–6.9)
POC LYMPH %: 45.9 % (ref 10–50)
POC MID %: 5.8 %M (ref 0–12)
RBC: 4.84 M/uL (ref 4.04–5.48)
RDW, POC: 13.3 %
WBC: 7.4 10*3/uL (ref 4.6–10.2)

## 2013-12-23 LAB — POCT URINALYSIS DIPSTICK
BILIRUBIN UA: NEGATIVE
GLUCOSE UA: NEGATIVE
KETONES UA: 15
NITRITE UA: POSITIVE
PH UA: 7
Protein, UA: 30
SPEC GRAV UA: 1.02
Urobilinogen, UA: 1

## 2013-12-23 LAB — POCT UA - MICROSCOPIC ONLY
Crystals, Ur, HPF, POC: NEGATIVE
MUCUS UA: NEGATIVE
RBC, URINE, MICROSCOPIC: NEGATIVE
Yeast, UA: NEGATIVE

## 2013-12-23 MED ORDER — FLUCONAZOLE 150 MG PO TABS: 150.0000 mg | ORAL_TABLET | Freq: Once | ORAL | Status: DC

## 2013-12-23 MED ORDER — PHENAZOPYRIDINE HCL 200 MG PO TABS: 200.0000 mg | ORAL_TABLET | Freq: Three times a day (TID) | ORAL | Status: DC | PRN

## 2013-12-23 MED ORDER — NITROFURANTOIN MONOHYD MACRO 100 MG PO CAPS: 100.0000 mg | ORAL_CAPSULE | Freq: Two times a day (BID) | ORAL | Status: AC

## 2013-12-23 NOTE — Progress Notes (Signed)
Subjective:    Patient ID: Jacqueline Brown, female    DOB: 11/25/1990, 23 y.o.   MRN: [id number]  HPI Pt presents to clinic with dysuria and urgency with urinary frequency.  Started yesterday and seems worse today.  Today has back pain mainly on the right side.  She has h/o frequent UTIs but has never had a kidney infection.  She is sexually active but has not had a change in sexual partner recently.   OTC meds - AZO - helps  Review of Systems  Gastrointestinal: Positive for nausea and abdominal pain (pressure).  Genitourinary: Positive for dysuria, urgency and frequency. Negative for vaginal discharge.  Musculoskeletal: Positive for back pain (more on the right side).       Objective:   Physical Exam  Vitals reviewed. Constitutional: She is oriented to person, place, and time. She appears well-developed and well-nourished.  HENT:  Head: Normocephalic and atraumatic.  Right Ear: External ear normal.  Left Ear: External ear normal.  Eyes: Conjunctivae are normal.  Cardiovascular: Normal rate, regular rhythm and normal heart sounds.   No murmur heard. Pulmonary/Chest: Effort normal and breath sounds normal.  Abdominal: Soft. Bowel sounds are normal. There is tenderness (suprapubic TTP) in the suprapubic area. There is no CVA tenderness.  Neurological: She is alert and oriented to person, place, and time.  Skin: Skin is warm and dry.  Psychiatric: She has a normal mood and affect. Her behavior is normal. Judgment and thought content normal.    Results for orders placed in visit on 12/23/13  POCT UA - MICROSCOPIC ONLY      Result Value Range   WBC, Ur, HPF, POC tntc     RBC, urine, microscopic neg     Bacteria, U Microscopic 4++     Mucus, UA neg     Epithelial cells, urine per micros 0-1     Crystals, Ur, HPF, POC neg     Casts, Ur, LPF, POC renal tubular     Yeast, UA neg    POCT URINALYSIS DIPSTICK      Result Value Range   Color, UA orange     Clarity, UA cloudy     Glucose, UA neg     Bilirubin, UA neg     Ketones, UA 15     Spec Grav, UA 1.020     Blood, UA tr-intact     pH, UA 7.0     Protein, UA 30     Urobilinogen, UA 1.0     Nitrite, UA positive     Leukocytes, UA moderate (2+)    POCT CBC      Result Value Range   WBC 7.4  4.6 - 10.2 K/uL   Lymph, poc 3.4  0.6 - 3.4   POC LYMPH PERCENT 45.9  10 - 50 %L   MID (cbc) 0.4  0 - 0.9   POC MID % 5.8  0 - 12 %M   POC Granulocyte 3.6  2 - 6.9   Granulocyte percent 48.3  37 - 80 %G   RBC 4.84  4.04 - 5.48 M/uL   Hemoglobin 14.9  12.2 - 16.2 g/dL   HCT, POC 29.546.4  28.437.7 - 47.9 %   MCV 95.9  80 - 97 fL   MCH, POC 30.8  27 - 31.2 pg   MCHC 32.1  31.8 - 35.4 g/dL   RDW, POC 13.213.3     Platelet Count, POC 315  142 - 424 K/uL  MPV 8.1  0 - 99.8 fL         Assessment & Plan:  Dysuria - Plan: POCT UA - Microscopic Only, POCT urinalysis dipstick  UTI (urinary tract infection) - Plan: POCT CBC, Urine culture, nitrofurantoin, macrocrystal-monohydrate, (MACROBID) 100 MG capsule, phenazopyridine (PYRIDIUM) 200 MG tablet, fluconazole (DIFLUCAN) 150 MG tablet (she has h/o developing yeast infections with abx)  She will push fluids.  Benny Lennert PA-C 12/23/2013 9:23 PM

## 2013-12-26 LAB — URINE CULTURE: Colony Count: >=100000

## 2014-01-09 ENCOUNTER — Other Ambulatory Visit: Payer: Self-pay

## 2014-07-14 ENCOUNTER — Ambulatory Visit (INDEPENDENT_AMBULATORY_CARE_PROVIDER_SITE_OTHER): Payer: 59 | Admitting: Physician Assistant

## 2014-07-14 VITALS — BP 94/56 | HR 114 | Temp 98.7°F | Resp 16 | Ht 66.25 in | Wt 161.8 lb

## 2014-07-14 DIAGNOSIS — J020 Streptococcal pharyngitis: Secondary | ICD-10-CM

## 2014-07-14 DIAGNOSIS — J029 Acute pharyngitis, unspecified: Secondary | ICD-10-CM

## 2014-07-14 LAB — POCT RAPID STREP A (OFFICE): RAPID STREP A SCREEN: POSITIVE — AB

## 2014-07-14 MED ORDER — PENICILLIN G BENZATHINE 1200000 UNIT/2ML IM SUSP
2.4000 10*6.[IU] | Freq: Once | INTRAMUSCULAR | Status: AC
  Administered 2014-07-14: 2.4 10*6.[IU] via INTRAMUSCULAR

## 2014-07-14 NOTE — Progress Notes (Signed)
   Subjective:    Patient ID: Jacqueline Brown, female    DOB: [birth year], 23 y.o.   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  She has some mild congestion but the throat is the worse part.  No fevers but she had has chills.  Review of Systems  Constitutional: Positive for chills. Negative for fever.  HENT: Positive for congestion (mild) and sore throat.        Objective:   Physical Exam  Vitals reviewed. Constitutional: She is oriented to person, place, and time. She appears well-developed and well-nourished.  HENT:  Head: Normocephalic and atraumatic.  Right Ear: External ear normal.  Left Ear: External ear normal.  Eyes: Conjunctivae are normal.  Neck: Normal range of motion.  Cardiovascular: Normal rate, regular rhythm and normal heart sounds.   No murmur heard. Pulmonary/Chest: Effort normal and breath sounds normal. She has no wheezes.  Lymphadenopathy:       Head (right side): No tonsillar and no preauricular adenopathy present.       Head (left side): No tonsillar and no preauricular adenopathy present.    She has cervical adenopathy (L>R and more tender).       Right cervical: Superficial cervical adenopathy present.       Left cervical: Superficial cervical adenopathy present.       Right: No supraclavicular adenopathy present.       Left: No supraclavicular adenopathy present.  Neurological: She is alert and oriented to person, place, and time.  Skin: Skin is warm and dry.  Psychiatric: She has a normal mood and affect. Her behavior is normal. Judgment and thought content normal.   Results for orders placed in visit on 07/14/14  POCT RAPID STREP A (OFFICE)      Result Value Ref Range   Rapid Strep A Screen Positive (*) Negative        Assessment & Plan:  Sore throat - Plan: POCT rapid strep A  Streptococcal sore throat -  Plan: penicillin g benzathine (BICILLIN LA) 1200000 UNIT/2ML injection 2.4 Million Units   Benny Lennert PA-C  [facility] 07/14/2014 12:29 PM

## 2014-08-24 ENCOUNTER — Ambulatory Visit (INDEPENDENT_AMBULATORY_CARE_PROVIDER_SITE_OTHER): Payer: 59

## 2014-08-24 ENCOUNTER — Ambulatory Visit (INDEPENDENT_AMBULATORY_CARE_PROVIDER_SITE_OTHER): Payer: 59 | Admitting: Family Medicine

## 2014-08-24 VITALS — BP 122/74 | HR 90 | Temp 99.0°F | Resp 16 | Ht 67.0 in | Wt 163.0 lb

## 2014-08-24 DIAGNOSIS — S339XXA Sprain of unspecified parts of lumbar spine and pelvis, initial encounter: Secondary | ICD-10-CM

## 2014-08-24 DIAGNOSIS — T148XXA Other injury of unspecified body region, initial encounter: Secondary | ICD-10-CM

## 2014-08-24 DIAGNOSIS — M542 Cervicalgia: Secondary | ICD-10-CM

## 2014-08-24 DIAGNOSIS — T148 Other injury of unspecified body region: Secondary | ICD-10-CM

## 2014-08-24 DIAGNOSIS — S335XXA Sprain of ligaments of lumbar spine, initial encounter: Secondary | ICD-10-CM

## 2014-08-24 MED ORDER — CYCLOBENZAPRINE HCL 5 MG PO TABS: 5.0000 mg | ORAL_TABLET | Freq: Every evening | ORAL | Status: DC | PRN
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
## 2014-08-24 NOTE — Patient Instructions (Signed)
Cervical Strain and Sprain (Whiplash) with Rehab Cervical strain and sprain are injuries that commonly occur with "whiplash" injuries. Whiplash occurs when the neck is forcefully whipped backward or forward, such as during a motor vehicle accident or during contact sports. The muscles, ligaments, tendons, discs, and nerves of the neck are susceptible to injury when this occurs. RISK FACTORS Risk of having a whiplash injury increases if:  Osteoarthritis of the spine.  Situations that make head or neck accidents or trauma more likely.  High-risk sports (football, rugby, wrestling, hockey, auto racing, gymnastics, diving, contact karate, or boxing).  Poor strength and flexibility of the neck.  Previous neck injury.  Poor tackling technique.  Improperly fitted or padded equipment. SYMPTOMS   Pain or stiffness in the front or back of neck or both.  Symptoms may present immediately or up to 24 hours after injury.  Dizziness, headache, nausea, and vomiting.  Muscle spasm with soreness and stiffness in the neck.  Tenderness and swelling at the injury site. PREVENTION  Learn and use proper technique (avoid tackling with the head, spearing, and head-butting; use proper falling techniques to avoid landing on the head).  Warm up and stretch properly before activity.  Maintain physical fitness:  Strength, flexibility, and endurance.  Cardiovascular fitness.  Wear properly fitted and padded protective equipment, such as padded soft collars, for participation in contact sports. PROGNOSIS  Recovery from cervical strain and sprain injuries is dependent on the extent of the injury. These injuries are usually curable in 1 week to 3 months with appropriate treatment.  RELATED COMPLICATIONS   Temporary numbness and weakness may occur if the nerve roots are damaged, and this may persist until the nerve has completely healed.  Chronic pain due to frequent recurrence of  symptoms.  Prolonged healing, especially if activity is resumed too soon (before complete recovery). TREATMENT  Treatment initially involves the use of ice and medication to help reduce pain and inflammation. It is also important to perform strengthening and stretching exercises and modify activities that worsen symptoms so the injury does not get worse. These exercises may be performed at home or with a therapist. For patients who experience severe symptoms, a soft, padded collar may be recommended to be worn around the neck.  Improving your posture may help reduce symptoms. Posture improvement includes pulling your chin and abdomen in while sitting or standing. If you are sitting, sit in a firm chair with your buttocks against the back of the chair. While sleeping, try replacing your pillow with a small towel rolled to 2 inches in diameter, or use a cervical pillow or soft cervical collar. Poor sleeping positions delay healing.  For patients with nerve root damage, which causes numbness or weakness, the use of a cervical traction apparatus may be recommended. Surgery is rarely necessary for these injuries. However, cervical strain and sprains that are present at birth (congenital) may require surgery. MEDICATION   If pain medication is necessary, nonsteroidal anti-inflammatory medications, such as aspirin and ibuprofen, or other minor pain relievers, such as acetaminophen, are often recommended.  Do not take pain medication for 7 days before surgery.  Prescription pain relievers may be given if deemed necessary by your caregiver. Use only as directed and only as much as you need. HEAT AND COLD:   Cold treatment (icing) relieves pain and reduces inflammation. Cold treatment should be applied for 10 to 15 minutes every 2 to 3 hours for inflammation and pain and immediately after any activity that aggravates   your symptoms. Use ice packs or an ice massage.  Heat treatment may be used prior to  performing the stretching and strengthening activities prescribed by your caregiver, physical therapist, or athletic trainer. Use a heat pack or a warm soak. SEEK MEDICAL CARE IF:   Symptoms get worse or do not improve in 2 weeks despite treatment.  New, unexplained symptoms develop (drugs used in treatment may produce side effects). EXERCISES RANGE OF MOTION (ROM) AND STRETCHING EXERCISES - Cervical Strain and Sprain These exercises may help you when beginning to rehabilitate your injury. In order to successfully resolve your symptoms, you must improve your posture. These exercises are designed to help reduce the forward-head and rounded-shoulder posture which contributes to this condition. Your symptoms may resolve with or without further involvement from your physician, physical therapist or athletic trainer. While completing these exercises, remember:   Restoring tissue flexibility helps normal motion to return to the joints. This allows healthier, less painful movement and activity.  An effective stretch should be held for at least 20 seconds, although you may need to begin with shorter hold times for comfort.  A stretch should never be painful. You should only feel a gentle lengthening or release in the stretched tissue. STRETCH- Axial Extensors  Lie on your back on the floor. You may bend your knees for comfort. Place a rolled-up hand towel or dish towel, about 2 inches in diameter, under the part of your head that makes contact with the floor.  Gently tuck your chin, as if trying to make a "double chin," until you feel a gentle stretch at the base of your head.  Hold __________ seconds. Repeat __________ times. Complete this exercise __________ times per day.  STRETCH - Axial Extension   Stand or sit on a firm surface. Assume a good posture: chest up, shoulders drawn back, abdominal muscles slightly tense, knees unlocked (if standing) and feet hip width apart.  Slowly retract your  chin so your head slides back and your chin slightly lowers. Continue to look straight ahead.  You should feel a gentle stretch in the back of your head. Be certain not to feel an aggressive stretch since this can cause headaches later.  Hold for __________ seconds. Repeat __________ times. Complete this exercise __________ times per day. STRETCH - Cervical Side Bend   Stand or sit on a firm surface. Assume a good posture: chest up, shoulders drawn back, abdominal muscles slightly tense, knees unlocked (if standing) and feet hip width apart.  Without letting your nose or shoulders move, slowly tip your right / left ear to your shoulder until your feel a gentle stretch in the muscles on the opposite side of your neck.  Hold __________ seconds. Repeat __________ times. Complete this exercise __________ times per day. STRETCH - Cervical Rotators   Stand or sit on a firm surface. Assume a good posture: chest up, shoulders drawn back, abdominal muscles slightly tense, knees unlocked (if standing) and feet hip width apart.  Keeping your eyes level with the ground, slowly turn your head until you feel a gentle stretch along the back and opposite side of your neck.  Hold __________ seconds. Repeat __________ times. Complete this exercise __________ times per day. RANGE OF MOTION - Neck Circles   Stand or sit on a firm surface. Assume a good posture: chest up, shoulders drawn back, abdominal muscles slightly tense, knees unlocked (if standing) and feet hip width apart.  Gently roll your head down and around from the   back of one shoulder to the back of the other. The motion should never be forced or painful.  Repeat the motion 10-20 times, or until you feel the neck muscles relax and loosen. Repeat __________ times. Complete the exercise __________ times per day. STRENGTHENING EXERCISES - Cervical Strain and Sprain These exercises may help you when beginning to rehabilitate your injury. They may  resolve your symptoms with or without further involvement from your physician, physical therapist, or athletic trainer. While completing these exercises, remember:   Muscles can gain both the endurance and the strength needed for everyday activities through controlled exercises.  Complete these exercises as instructed by your physician, physical therapist, or athletic trainer. Progress the resistance and repetitions only as guided.  You may experience muscle soreness or fatigue, but the pain or discomfort you are trying to eliminate should never worsen during these exercises. If this pain does worsen, stop and make certain you are following the directions exactly. If the pain is still present after adjustments, discontinue the exercise until you can discuss the trouble with your clinician. STRENGTH - Cervical Flexors, Isometric  Face a wall, standing about 6 inches away. Place a small pillow, a ball about 6-8 inches in diameter, or a folded towel between your forehead and the wall.  Slightly tuck your chin and gently push your forehead into the soft object. Push only with mild to moderate intensity, building up tension gradually. Keep your jaw and forehead relaxed.  Hold 10 to 20 seconds. Keep your breathing relaxed.  Release the tension slowly. Relax your neck muscles completely before you start the next repetition. Repeat __________ times. Complete this exercise __________ times per day. STRENGTH- Cervical Lateral Flexors, Isometric   Stand about 6 inches away from a wall. Place a small pillow, a ball about 6-8 inches in diameter, or a folded towel between the side of your head and the wall.  Slightly tuck your chin and gently tilt your head into the soft object. Push only with mild to moderate intensity, building up tension gradually. Keep your jaw and forehead relaxed.  Hold 10 to 20 seconds. Keep your breathing relaxed.  Release the tension slowly. Relax your neck muscles completely  before you start the next repetition. Repeat __________ times. Complete this exercise __________ times per day. STRENGTH - Cervical Extensors, Isometric   Stand about 6 inches away from a wall. Place a small pillow, a ball about 6-8 inches in diameter, or a folded towel between the back of your head and the wall.  Slightly tuck your chin and gently tilt your head back into the soft object. Push only with mild to moderate intensity, building up tension gradually. Keep your jaw and forehead relaxed.  Hold 10 to 20 seconds. Keep your breathing relaxed.  Release the tension slowly. Relax your neck muscles completely before you start the next repetition. Repeat __________ times. Complete this exercise __________ times per day. POSTURE AND BODY MECHANICS CONSIDERATIONS - Cervical Strain and Sprain Keeping correct posture when sitting, standing or completing your activities will reduce the stress put on different body tissues, allowing injured tissues a chance to heal and limiting painful experiences. The following are general guidelines for improved posture. Your physician or physical therapist will provide you with any instructions specific to your needs. While reading these guidelines, remember:  The exercises prescribed by your provider will help you have the flexibility and strength to maintain correct postures.  The correct posture provides the optimal environment for your joints to   work. All of your joints have less wear and tear when properly supported by a spine with good posture. This means you will experience a healthier, less painful body.  Correct posture must be practiced with all of your activities, especially prolonged sitting and standing. Correct posture is as important when doing repetitive low-stress activities (typing) as it is when doing a single heavy-load activity (lifting). PROLONGED STANDING WHILE SLIGHTLY LEANING FORWARD When completing a task that requires you to lean  forward while standing in one place for a long time, place either foot up on a stationary 2- to 4-inch high object to help maintain the best posture. When both feet are on the ground, the low back tends to lose its slight inward curve. If this curve flattens (or becomes too large), then the back and your other joints will experience too much stress, fatigue more quickly, and can cause pain.  RESTING POSITIONS Consider which positions are most painful for you when choosing a resting position. If you have pain with flexion-based activities (sitting, bending, stooping, squatting), choose a position that allows you to rest in a less flexed posture. You would want to avoid curling into a fetal position on your side. If your pain worsens with extension-based activities (prolonged standing, working overhead), avoid resting in an extended position such as sleeping on your stomach. Most people will find more comfort when they rest with their spine in a more neutral position, neither too rounded nor too arched. Lying on a non-sagging bed on your side with a pillow between your knees, or on your back with a pillow under your knees will often provide some relief. Keep in mind, being in any one position for a prolonged period of time, no matter how correct your posture, can still lead to stiffness. WALKING Walk with an upright posture. Your ears, shoulders, and hips should all line up. OFFICE WORK When working at a desk, create an environment that supports good, upright posture. Without extra support, muscles fatigue and lead to excessive strain on joints and other tissues. CHAIR:  A chair should be able to slide under your desk when your back makes contact with the back of the chair. This allows you to work closely.  The chair's height should allow your eyes to be level with the upper part of your monitor and your hands to be slightly lower than your elbows.  Body position:  Your feet should make contact with the  floor. If this is not possible, use a foot rest.  Keep your ears over your shoulders. This will reduce stress on your neck and low back. Document Released: 10/30/2005 Document Revised: 03/16/2014 Document Reviewed: 02/11/2009 [employer] Patient Information 2015 [employer]. This information is not intended to replace advice given to you by your health care provider. Make sure you discuss any questions you have with your health care provider.  

## 2014-09-09 ENCOUNTER — Other Ambulatory Visit: Payer: Self-pay

## 2015-02-23 ENCOUNTER — Telehealth: Payer: Self-pay

## 2015-02-23 NOTE — Telephone Encounter (Signed)
Called patient to let her know that we would be mailing her medical records for DOS 08/24/14 to her address on file.

## 2015-03-27 ENCOUNTER — Emergency Department (INDEPENDENT_AMBULATORY_CARE_PROVIDER_SITE_OTHER)
Admission: EM | Admit: 2015-03-27 | Discharge: 2015-03-27 | Disposition: A | Discharge status: Home / Self Care | Payer: Self-pay | Source: Home / Self Care | Attending: Emergency Medicine | Admitting: Emergency Medicine

## 2015-03-27 DIAGNOSIS — J039 Acute tonsillitis, unspecified: Secondary | ICD-10-CM

## 2015-03-27 LAB — POCT RAPID STREP A: Streptococcus, Group A Screen (Direct): NEGATIVE

## 2015-03-27 MED ORDER — CLINDAMYCIN HCL 150 MG PO CAPS: 150.0000 mg | ORAL_CAPSULE | Freq: Three times a day (TID) | ORAL | Status: DC

## 2015-03-27 NOTE — ED Provider Notes (Signed)
CSN: [id number]     Arrival date & time 03/27/15  1745 History   First MD Initiated Contact with Patient 03/27/15 1810     Chief Complaint  Patient presents with  . Sore Throat   (Consider location/radiation/quality/duration/timing/severity/associated sxs/prior Treatment) HPI  She is a 24 year old woman here for evaluation of sore throat. She states this started about 3 days ago with a mild sore throat and a mild headache. This morning, her symptoms acutely worsened with an episode of skin sensitivity, worsening headache, and worsening sore throat. She is able to eat and drink, but does not have an appetite. She denies nausea or vomiting. No nasal congestion or rhinorrhea. She reports a very mild cough. No documented fever. She has been taking ibuprofen with some improvement.  Past Medical History  Diagnosis Date  . Ectopic pregnancy July 2013    methotrexate x2  . Urinary tract infection   . Allergy   . Depression    Past Surgical History  Procedure Laterality Date  . No past surgeries     Family History  Problem Relation Age of Onset  . Heart disease Father   . Other Neg Hx    History  Substance Use Topics  . Smoking status: Never Smoker   . Smokeless tobacco: Never Used  . Alcohol Use: Yes     Comment: socially    OB History    Gravida Para Term Preterm AB TAB SAB Ectopic Multiple Living   1    0   0       Review of Systems As in history of present illness Allergies  Sulfa antibiotics  Home Medications   Prior to Admission medications   Medication Sig Start Date End Date Taking? Authorizing Provider  clindamycin (CLEOCIN) 150 MG capsule Take 1 capsule (150 mg total) by mouth 3 (three) times daily. 03/27/15   Charm RingsErin J Rosendo Couser, MD  cyclobenzaprine (FLEXERIL) 5 MG tablet Take 1 tablet (5 mg total) by mouth at bedtime as needed. 08/24/14   Thao P Le, DO  norethindrone-ethinyl estradiol (OVCON-50) 1-50 MG-MCG tablet Take 1 tablet by mouth daily.    Historical Provider, MD    BP 117/76 mmHg  Pulse 94  Temp(Src) 98.7 F (37.1 C) (Oral)  Resp 16  SpO2 96%  LMP 03/24/2015 Physical Exam  Constitutional: She is oriented to person, place, and time. She appears well-developed and well-nourished. No distress.  HENT:  Nose: Nose normal.  Mouth/Throat: Oropharyngeal exudate present.  Tonsils are erythematous and enlarged  Eyes: Conjunctivae are normal.  Neck: Neck supple.  Cardiovascular: Normal rate.   Pulmonary/Chest: Effort normal.  Lymphadenopathy:    She has no cervical adenopathy.  Neurological: She is alert and oriented to person, place, and time.    ED Course  Procedures (including critical care time) Labs Review Labs Reviewed  POCT RAPID STREP A ([facility] CARE ONLY)    Imaging Review No results found.   MDM   1. Tonsillitis    Rapid strep negative. We'll cover atypicals with clindamycin. Follow-up as needed.    Charm RingsErin J Chandelle Harkey, MD 03/27/15 [phone]

## 2015-03-27 NOTE — Discharge Instructions (Signed)
You have an infection of your tonsils. Take clindamycin 1 pill 3 times a day for 7 days. You can continue to take ibuprofen as needed. You can try Chloraseptic spray to help with the throat pain. You should see improvement in the next 24-48 hours.

## 2015-03-27 NOTE — ED Notes (Signed)
Pt  Reports  Symptoms  Of  sorethroat       -  Body  Aches        As   Well  As  headache  With  Onset  3  Days  Ago          Pt   Reports         Symptoms       Not  releived  By  otc         Motrin             Pt  Sitting    Upright  On  Exam table         Speaking  In  Complete  sentances

## 2015-03-29 LAB — CULTURE, GROUP A STREP: Strep A Culture: NEGATIVE

## 2015-08-02 ENCOUNTER — Ambulatory Visit (INDEPENDENT_AMBULATORY_CARE_PROVIDER_SITE_OTHER): Payer: 59 | Admitting: Urgent Care

## 2015-08-02 VITALS — BP 114/66 | HR 57 | Temp 97.8°F | Resp 18 | Ht 66.5 in | Wt 153.0 lb

## 2015-08-02 DIAGNOSIS — R42 Dizziness and giddiness: Secondary | ICD-10-CM | POA: Diagnosis not present

## 2015-08-02 DIAGNOSIS — M546 Pain in thoracic spine: Secondary | ICD-10-CM

## 2015-08-02 DIAGNOSIS — M791 Myalgia, unspecified site: Secondary | ICD-10-CM

## 2015-08-02 DIAGNOSIS — R110 Nausea: Secondary | ICD-10-CM

## 2015-08-02 DIAGNOSIS — R102 Pelvic and perineal pain: Secondary | ICD-10-CM

## 2015-08-02 DIAGNOSIS — B349 Viral infection, unspecified: Secondary | ICD-10-CM | POA: Diagnosis not present

## 2015-08-02 DIAGNOSIS — R195 Other fecal abnormalities: Secondary | ICD-10-CM | POA: Diagnosis not present

## 2015-08-02 LAB — POCT CBC
GRANULOCYTE PERCENT: 43.3 % (ref 37–80)
HEMATOCRIT: 43.5 % (ref 37.7–47.9)
Hemoglobin: 14 g/dL (ref 12.2–16.2)
Lymph, poc: 2.8 (ref 0.6–3.4)
MCH, POC: 28.3 pg (ref 27–31.2)
MCHC: 32.2 g/dL (ref 31.8–35.4)
MCV: 88 fL (ref 80–97)
MID (cbc): 0.2 (ref 0–0.9)
MPV: 7.3 fL (ref 0–99.8)
POC GRANULOCYTE: 2.3 (ref 2–6.9)
POC LYMPH %: 53 % — AB (ref 10–50)
POC MID %: 3.7 %M (ref 0–12)
Platelet Count, POC: 164 10*3/uL (ref 142–424)
RBC: 4.95 M/uL (ref 4.04–5.48)
RDW, POC: 13 %
WBC: 5.3 10*3/uL (ref 4.6–10.2)

## 2015-08-02 LAB — POCT UA - MICROSCOPIC ONLY
Bacteria, U Microscopic: NEGATIVE
CASTS, UR, LPF, POC: NEGATIVE
Crystals, Ur, HPF, POC: NEGATIVE
Mucus, UA: NEGATIVE
RBC, urine, microscopic: NEGATIVE
YEAST UA: NEGATIVE

## 2015-08-02 LAB — COMPREHENSIVE METABOLIC PANEL
ALT: 28 U/L (ref 6–29)
AST: 21 U/L (ref 10–30)
Albumin: 4 g/dL (ref 3.6–5.1)
Alkaline Phosphatase: 72 U/L (ref 33–115)
BUN: 11 mg/dL (ref 7–25)
CHLORIDE: 104 mmol/L (ref 98–110)
CO2: 26 mmol/L (ref 20–31)
Calcium: 8.7 mg/dL (ref 8.6–10.2)
Creat: 0.74 mg/dL (ref 0.50–1.10)
Glucose, Bld: 84 mg/dL (ref 65–99)
Potassium: 4.3 mmol/L (ref 3.5–5.3)
Sodium: 139 mmol/L (ref 135–146)
Total Bilirubin: 1.7 mg/dL — ABNORMAL HIGH (ref 0.2–1.2)
Total Protein: 6.7 g/dL (ref 6.1–8.1)

## 2015-08-02 LAB — POCT URINALYSIS DIPSTICK
Bilirubin, UA: NEGATIVE
Glucose, UA: NEGATIVE
KETONES UA: NEGATIVE
LEUKOCYTES UA: NEGATIVE
Nitrite, UA: NEGATIVE
PH UA: 5.5
RBC UA: NEGATIVE
Spec Grav, UA: 1.025
Urobilinogen, UA: 1

## 2015-08-02 MED ORDER — CYCLOBENZAPRINE HCL 10 MG PO TABS
5.0000 mg | ORAL_TABLET | Freq: Every day | ORAL | Status: DC
Start: 2015-08-02 — End: 2017-04-20

## 2015-08-02 MED ORDER — MELOXICAM 15 MG PO TABS: 7.5000 mg | ORAL_TABLET | Freq: Every day | ORAL | Status: DC

## 2015-08-02 NOTE — Progress Notes (Signed)
MRN: [id number] DOB: 12-03-1990  Subjective:   Jacqueline Brown is a 24 y.o. female presenting for chief complaint of Generalized Body Aches; Chills; Nausea; and Diarrhea  Reports ~4 day history of myalgia, nausea, loose stools and mid back pain. Today she had dizziness and this prompted her to come into the clinic. Has tried Aleve and Pepto with some relief of her aches and loose stools. Patient admits that her symptoms started with body aches after intense hiking and climbing on Saturday morning. She states that she did go swimming in waterfalls as well and might not have hydrated as well she normally does. She does exercise regularly, does cross fit. Denies fever, abdominal pain, blood in her stools, dysuria, hematuria, urinary frequency, falls, numbness or tingling, incontinence. Denies heart racing, palpitations, shortness of breath, chest pain. Denies any other aggravating or relieving factors, no other questions or concerns.  Latesa has a current medication list which includes the following prescription(s): cyanocobalamin, norethindrone-ethinyl estradiol, and omega-3 acid ethyl esters. Also is allergic to sulfa antibiotics.  Hadlei  has a past medical history of Ectopic pregnancy (July 2013); Urinary tract infection; Allergy; and Depression. Also  has past surgical history that includes No past surgeries.  Objective:   Vitals: BP 114/66 mmHg  Pulse 57  Temp(Src) 97.8 F (36.6 C) (Oral)  Resp 18  Ht 5' 6.5" (1.689 m)  Wt 153 lb (69.4 kg)  BMI 24.33 kg/m2  SpO2 96%  LMP 07/22/2015  Physical Exam  Constitutional: She is oriented to person, place, and time. She appears well-developed and well-nourished.  HENT:  Mouth/Throat: Oropharynx is clear and moist.  Eyes: No scleral icterus.  Cardiovascular: Normal rate, regular rhythm and intact distal pulses.  Exam reveals no gallop and no friction rub.   No murmur heard. Pulmonary/Chest: No respiratory distress. She has no wheezes. She  has no rales.  Abdominal: Soft. Bowel sounds are normal. She exhibits no distension and no mass. There is tenderness (generalized throughout, worse in lower pelvic area).  Musculoskeletal: She exhibits no edema.  Lymphadenopathy:    She has no cervical adenopathy.  Neurological: She is alert and oriented to person, place, and time.  Skin: Skin is warm and dry. No rash noted. No erythema. No pallor.  Psychiatric: She has a normal mood and affect.   Results for orders placed or performed in visit on 08/02/15 (from the past 24 hour(s))  POCT CBC     Status: Abnormal   Collection Time: 08/02/15 12:05 PM  Result Value Ref Range   WBC 5.3 4.6 - 10.2 K/uL   Lymph, poc 2.8 0.6 - 3.4   POC LYMPH PERCENT 53.0 (A) 10 - 50 %L   MID (cbc) 0.2 0 - 0.9   POC MID % 3.7 0 - 12 %M   POC Granulocyte 2.3 2 - 6.9   Granulocyte percent 43.3 37 - 80 %G   RBC 4.95 4.04 - 5.48 M/uL   Hemoglobin 14.0 12.2 - 16.2 g/dL   HCT, POC 40.9 81.1 - 47.9 %   MCV 88.0 80 - 97 fL   MCH, POC 28.3 27 - 31.2 pg   MCHC 32.2 31.8 - 35.4 g/dL   RDW, POC 91.4 %   Platelet Count, POC 164 142 - 424 K/uL   MPV 7.3 0 - 99.8 fL  POCT UA - Microscopic Only     Status: None   Collection Time: 08/02/15 12:05 PM  Result Value Ref Range   WBC, Ur, HPF,  POC 0-4    RBC, urine, microscopic neg    Bacteria, U Microscopic neg    Mucus, UA neg    Epithelial cells, urine per micros 1-3    Crystals, Ur, HPF, POC neg    Casts, Ur, LPF, POC neg    Yeast, UA neg   POCT urinalysis dipstick     Status: None   Collection Time: 08/02/15 12:05 PM  Result Value Ref Range   Color, UA amber    Clarity, UA slightly cloudy    Glucose, UA neg    Bilirubin, UA neg    Ketones, UA neg    Spec Grav, UA 1.025    Blood, UA neg    pH, UA 5.5    Protein, UA trace    Urobilinogen, UA 1.0    Nitrite, UA neg    Leukocytes, UA Negative Negative   Assessment and Plan :   1. Viral syndrome 2. Dizziness 3. Nausea without vomiting 4. Pelvic pain  in female 5. Loose stools - Labs pending, discussed differential with patient and discussed the possibility of her undergoing a viral syndrome worsened by dehydration. Advised supportive care, return to clinic in 1 week if no improvement or sooner if worsening of symptoms as discussed in clinic including fevers, vomiting, abdominal pain, hematuria. Patient agreed.  6. Myalgia 7. Bilateral thoracic back pain - Likely related to her increased activity over the weekend and lack of hydration. - Start meloxicam for pain and inflammation, Flexeril for pain over para-spinal muscles. - Follow up as above.  Wallis Bamberg, PA-C [facility] [phone] 08/02/2015 11:20 AM

## 2015-08-02 NOTE — Patient Instructions (Signed)

## 2015-08-03 LAB — SEDIMENTATION RATE: Sed Rate: 4 mm/hr (ref 0–20)

## 2015-08-05 ENCOUNTER — Telehealth: Payer: Self-pay | Admitting: Urgent Care

## 2015-08-05 NOTE — Telephone Encounter (Signed)
Left message reporting normal ESR and Cmet. Continued plan for supportive care is the most likely cause of her symptoms may be due to a viral syndrome. Requested patient call back or return to clinic if no improvement in her symptoms.

## 2015-11-30 DIAGNOSIS — F422 Mixed obsessional thoughts and acts: Secondary | ICD-10-CM | POA: Diagnosis not present

## 2015-11-30 MED FILL — SERTRALINE HCL 50 MG TABLET: 50 | 30 days supply | Qty: 30 | Fill #0

## 2015-11-30 MED FILL — LORazepam 0.5 MG TABS: 0.5 | 15 days supply | Qty: 30 | Fill #0

## 2015-12-10 MED FILL — TRI-PREVIFEM TABLET: 0.18/0.215/ | 84 days supply | Qty: 84 | Fill #0

## 2015-12-30 DIAGNOSIS — F422 Mixed obsessional thoughts and acts: Secondary | ICD-10-CM | POA: Diagnosis not present

## 2015-12-30 MED FILL — SERTRALINE HCL 50 MG TABLET: 50 | 30 days supply | Qty: 60 | Fill #0

## 2016-01-06 DIAGNOSIS — E559 Vitamin D deficiency, unspecified: Secondary | ICD-10-CM | POA: Diagnosis not present

## 2016-01-06 DIAGNOSIS — E782 Mixed hyperlipidemia: Secondary | ICD-10-CM | POA: Diagnosis not present

## 2016-01-06 DIAGNOSIS — Z79899 Other long term (current) drug therapy: Secondary | ICD-10-CM | POA: Diagnosis not present

## 2016-01-06 DIAGNOSIS — Z1389 Encounter for screening for other disorder: Secondary | ICD-10-CM | POA: Diagnosis not present

## 2016-01-06 DIAGNOSIS — Z114 Encounter for screening for human immunodeficiency virus [HIV]: Secondary | ICD-10-CM | POA: Diagnosis not present

## 2016-01-06 DIAGNOSIS — D539 Nutritional anemia, unspecified: Secondary | ICD-10-CM | POA: Diagnosis not present

## 2016-01-06 DIAGNOSIS — R0602 Shortness of breath: Secondary | ICD-10-CM | POA: Diagnosis not present

## 2016-01-06 DIAGNOSIS — Z0000 Encounter for general adult medical examination without abnormal findings: Secondary | ICD-10-CM | POA: Diagnosis not present

## 2016-02-11 MED FILL — SERTRALINE HCL 50 MG TABLET: 50 | 30 days supply | Qty: 60 | Fill #1

## 2016-02-23 DIAGNOSIS — F422 Mixed obsessional thoughts and acts: Secondary | ICD-10-CM | POA: Diagnosis not present

## 2016-03-06 MED FILL — TRI-PREVIFEM TABLET: 0.18/0.215/ | 84 days supply | Qty: 84 | Fill #1

## 2016-04-04 DIAGNOSIS — F422 Mixed obsessional thoughts and acts: Secondary | ICD-10-CM | POA: Diagnosis not present

## 2016-04-04 MED FILL — DEXTROAMP-AMPHET ER 10 MG C: 10 | 30 days supply | Qty: 30 | Fill #0

## 2016-04-04 MED FILL — SERTRALINE HCL 50 MG TABLET: 50 | 40 days supply | Qty: 60 | Fill #0

## 2016-05-09 MED FILL — DEXTROAMP-AMPHET ER 10 MG C: 10 | 30 days supply | Qty: 30 | Fill #0

## 2016-05-23 MED FILL — TRI-PREVIFEM TABLET: 0.18/0.215/ | 84 days supply | Qty: 84 | Fill #2

## 2016-05-30 DIAGNOSIS — F422 Mixed obsessional thoughts and acts: Secondary | ICD-10-CM | POA: Diagnosis not present

## 2016-05-30 MED FILL — DEXTROAMP-AMPHET ER 15 MG C: 15 | 30 days supply | Qty: 30 | Fill #0

## 2016-06-27 DIAGNOSIS — F422 Mixed obsessional thoughts and acts: Secondary | ICD-10-CM | POA: Diagnosis not present

## 2016-06-29 MED FILL — SERTRALINE HCL 50 MG TABLET: 50 | 90 days supply | Qty: 45 | Fill #0

## 2016-06-29 MED FILL — DEXTROAMP-AMPHET ER 15 MG C: 15 | 30 days supply | Qty: 30 | Fill #0

## 2016-07-18 ENCOUNTER — Telehealth: Payer: 59 | Admitting: Family

## 2016-07-18 DIAGNOSIS — B9689 Other specified bacterial agents as the cause of diseases classified elsewhere: Secondary | ICD-10-CM

## 2016-07-18 DIAGNOSIS — J019 Acute sinusitis, unspecified: Secondary | ICD-10-CM | POA: Diagnosis not present

## 2016-07-18 MED ORDER — AMOXICILLIN-POT CLAVULANATE 875-125 MG PO TABS: 1.0000 | ORAL_TABLET | Freq: Two times a day (BID) | ORAL | 0 refills | Status: DC

## 2016-07-18 MED FILL — AMOX-CLAV 875-125 MG TABLET: 875-125 | 10 days supply | Qty: 20 | Fill #0

## 2016-07-18 NOTE — Progress Notes (Signed)

## 2016-08-09 MED FILL — DEXTROAMP-AMPHET ER 15 MG C: 15 | 30 days supply | Qty: 30 | Fill #0

## 2016-08-22 DIAGNOSIS — H5213 Myopia, bilateral: Secondary | ICD-10-CM | POA: Diagnosis not present

## 2016-09-01 DIAGNOSIS — Z3201 Encounter for pregnancy test, result positive: Secondary | ICD-10-CM | POA: Diagnosis not present

## 2016-09-01 DIAGNOSIS — Z3491 Encounter for supervision of normal pregnancy, unspecified, first trimester: Secondary | ICD-10-CM | POA: Diagnosis not present

## 2016-09-01 DIAGNOSIS — Z01419 Encounter for gynecological examination (general) (routine) without abnormal findings: Secondary | ICD-10-CM | POA: Diagnosis not present

## 2016-09-01 DIAGNOSIS — Z113 Encounter for screening for infections with a predominantly sexual mode of transmission: Secondary | ICD-10-CM | POA: Diagnosis not present

## 2016-09-01 DIAGNOSIS — N925 Other specified irregular menstruation: Secondary | ICD-10-CM | POA: Diagnosis not present

## 2016-09-29 DIAGNOSIS — Z3682 Encounter for antenatal screening for nuchal translucency: Secondary | ICD-10-CM | POA: Diagnosis not present

## 2016-09-29 DIAGNOSIS — Z3480 Encounter for supervision of other normal pregnancy, unspecified trimester: Secondary | ICD-10-CM | POA: Diagnosis not present

## 2016-09-29 LAB — OB RESULTS CONSOLE GC/CHLAMYDIA
CHLAMYDIA, DNA PROBE: NEGATIVE
GC PROBE AMP, GENITAL: NEGATIVE

## 2016-09-29 LAB — OB RESULTS CONSOLE RUBELLA ANTIBODY, IGM: RUBELLA: IMMUNE

## 2016-09-29 LAB — OB RESULTS CONSOLE RPR: RPR: NONREACTIVE

## 2016-09-29 LAB — OB RESULTS CONSOLE HEPATITIS B SURFACE ANTIGEN: Hepatitis B Surface Ag: NEGATIVE

## 2016-09-29 LAB — OB RESULTS CONSOLE ABO/RH: RH Type: POSITIVE

## 2016-09-29 LAB — OB RESULTS CONSOLE HIV ANTIBODY (ROUTINE TESTING): HIV: NONREACTIVE

## 2016-10-27 DIAGNOSIS — Z3480 Encounter for supervision of other normal pregnancy, unspecified trimester: Secondary | ICD-10-CM | POA: Diagnosis not present

## 2016-11-13 NOTE — L&D Delivery Note (Signed)
Delivery Note Patient pushed for less than 30 minutes when she was noted to be C/C/+3.  At 10:17 AM a viable and healthy female was delivered via Vaginal, Spontaneous Delivery (Presentation:OA ) 4 nuchal cords reduced, on perineum. Shoulders and body easily delivered.  Baby placed on maternal abdomen.  APGAR: 9, 9; weight 6 lb 5.1 oz (2865 g).   Placenta spontaneously delivered intact, 3 vessels with the following complications: none   Anesthesia:   Episiotomy: None Lacerations: None Suture Repair: n/a Est. Blood Loss (mL): 300  Mom to postpartum.  Baby to Couplet care / Skin to Skin.  Essie HartINN, Yaritza Leist STACIA 04/18/2017, 2:12 PM

## 2016-12-01 DIAGNOSIS — Z363 Encounter for antenatal screening for malformations: Secondary | ICD-10-CM | POA: Diagnosis not present

## 2016-12-01 DIAGNOSIS — Z369 Encounter for antenatal screening, unspecified: Secondary | ICD-10-CM | POA: Diagnosis not present

## 2016-12-29 DIAGNOSIS — Z369 Encounter for antenatal screening, unspecified: Secondary | ICD-10-CM | POA: Diagnosis not present

## 2016-12-29 DIAGNOSIS — O359XX1 Maternal care for (suspected) fetal abnormality and damage, unspecified, fetus 1: Secondary | ICD-10-CM | POA: Diagnosis not present

## 2016-12-29 MED FILL — SERTRALINE HCL 50 MG TABLET: 50 | 90 days supply | Qty: 90 | Fill #0

## 2016-12-29 MED FILL — VALACYCLOVIR HCL 500 MG TAB: 500 | 90 days supply | Qty: 90 | Fill #0

## 2017-01-16 DIAGNOSIS — Z23 Encounter for immunization: Secondary | ICD-10-CM | POA: Diagnosis not present

## 2017-01-16 DIAGNOSIS — Z3480 Encounter for supervision of other normal pregnancy, unspecified trimester: Secondary | ICD-10-CM | POA: Diagnosis not present

## 2017-03-20 DIAGNOSIS — Z3400 Encounter for supervision of normal first pregnancy, unspecified trimester: Secondary | ICD-10-CM | POA: Diagnosis not present

## 2017-03-23 DIAGNOSIS — Z3480 Encounter for supervision of other normal pregnancy, unspecified trimester: Secondary | ICD-10-CM | POA: Diagnosis not present

## 2017-03-23 DIAGNOSIS — Z369 Encounter for antenatal screening, unspecified: Secondary | ICD-10-CM | POA: Diagnosis not present

## 2017-04-17 ENCOUNTER — Encounter (HOSPITAL_COMMUNITY): Payer: Self-pay | Admitting: *Deleted

## 2017-04-17 ENCOUNTER — Telehealth (HOSPITAL_COMMUNITY): Payer: Self-pay | Admitting: *Deleted

## 2017-04-17 NOTE — Telephone Encounter (Signed)
Preadmission screen  

## 2017-04-18 ENCOUNTER — Inpatient Hospital Stay (HOSPITAL_COMMUNITY): Payer: 59 | Admitting: Anesthesiology

## 2017-04-18 ENCOUNTER — Inpatient Hospital Stay (HOSPITAL_COMMUNITY)
Admission: AD | Admit: 2017-04-18 | Discharge: 2017-04-20 | DRG: 775 | Disposition: A | Discharge status: Home / Self Care | Payer: 59 | Source: Ambulatory Visit | Attending: Obstetrics & Gynecology | Admitting: Obstetrics & Gynecology

## 2017-04-18 ENCOUNTER — Encounter (HOSPITAL_COMMUNITY): Payer: Self-pay | Admitting: *Deleted

## 2017-04-18 DIAGNOSIS — Z3A39 39 weeks gestation of pregnancy: Secondary | ICD-10-CM

## 2017-04-18 DIAGNOSIS — Z3493 Encounter for supervision of normal pregnancy, unspecified, third trimester: Secondary | ICD-10-CM | POA: Diagnosis present

## 2017-04-18 LAB — RPR: RPR Ser Ql: NONREACTIVE

## 2017-04-18 LAB — CBC
HEMATOCRIT: 37.4 % (ref 36.0–46.0)
HEMOGLOBIN: 13 g/dL (ref 12.0–15.0)
MCH: 30.2 pg (ref 26.0–34.0)
MCHC: 34.8 g/dL (ref 30.0–36.0)
MCV: 86.8 fL (ref 78.0–100.0)
Platelets: 243 10*3/uL (ref 150–400)
RBC: 4.31 MIL/uL (ref 3.87–5.11)
RDW: 14.6 % (ref 11.5–15.5)
WBC: 9.9 10*3/uL (ref 4.0–10.5)

## 2017-04-18 LAB — TYPE AND SCREEN
ABO/RH(D): O POS
Antibody Screen: NEGATIVE

## 2017-04-18 MED ORDER — FLEET ENEMA 7-19 GM/118ML RE ENEM: 1.0000 | ENEMA | RECTAL | Status: DC | PRN

## 2017-04-18 MED ORDER — OXYTOCIN 40 UNITS IN LACTATED RINGERS INFUSION - SIMPLE MED
2.5000 [IU]/h | INTRAVENOUS | Status: DC
  Filled 2017-04-18: qty 1000

## 2017-04-18 MED ORDER — SENNOSIDES-DOCUSATE SODIUM 8.6-50 MG PO TABS
2.0000 | ORAL_TABLET | ORAL | Status: DC
  Administered 2017-04-18 – 2017-04-19 (×2): 2 via ORAL
  Filled 2017-04-18 (×2): qty 2

## 2017-04-18 MED ORDER — EPHEDRINE 5 MG/ML INJ
10.0000 mg | INTRAVENOUS | Status: DC | PRN
  Filled 2017-04-18: qty 2

## 2017-04-18 MED ORDER — ONDANSETRON HCL 4 MG/2ML IJ SOLN: 4.0000 mg | Freq: Four times a day (QID) | INTRAMUSCULAR | Status: DC | PRN

## 2017-04-18 MED ORDER — OXYTOCIN BOLUS FROM INFUSION
500.0000 mL | Freq: Once | INTRAVENOUS | Status: AC
  Administered 2017-04-18: 500 mL/h via INTRAVENOUS

## 2017-04-18 MED ORDER — PHENYLEPHRINE 40 MCG/ML (10ML) SYRINGE FOR IV PUSH (FOR BLOOD PRESSURE SUPPORT)
80.0000 ug | PREFILLED_SYRINGE | INTRAVENOUS | Status: DC | PRN
  Filled 2017-04-18: qty 5

## 2017-04-18 MED ORDER — OXYCODONE-ACETAMINOPHEN 5-325 MG PO TABS: 1.0000 | ORAL_TABLET | ORAL | Status: DC | PRN

## 2017-04-18 MED ORDER — DIBUCAINE 1 % RE OINT: 1.0000 "application " | TOPICAL_OINTMENT | RECTAL | Status: DC | PRN

## 2017-04-18 MED ORDER — ACETAMINOPHEN 325 MG PO TABS: 650.0000 mg | ORAL_TABLET | ORAL | Status: DC | PRN

## 2017-04-18 MED ORDER — PHENYLEPHRINE 40 MCG/ML (10ML) SYRINGE FOR IV PUSH (FOR BLOOD PRESSURE SUPPORT)
80.0000 ug | PREFILLED_SYRINGE | INTRAVENOUS | Status: DC | PRN
  Filled 2017-04-18: qty 10
  Filled 2017-04-18: qty 5

## 2017-04-18 MED ORDER — LACTATED RINGERS IV SOLN: 500.0000 mL | Freq: Once | INTRAVENOUS | Status: DC

## 2017-04-18 MED ORDER — WITCH HAZEL-GLYCERIN EX PADS: 1.0000 "application " | MEDICATED_PAD | CUTANEOUS | Status: DC | PRN

## 2017-04-18 MED ORDER — OXYCODONE-ACETAMINOPHEN 5-325 MG PO TABS: 2.0000 | ORAL_TABLET | ORAL | Status: DC | PRN

## 2017-04-18 MED ORDER — DIPHENHYDRAMINE HCL 50 MG/ML IJ SOLN: 12.5000 mg | INTRAMUSCULAR | Status: DC | PRN

## 2017-04-18 MED ORDER — FENTANYL 2.5 MCG/ML BUPIVACAINE 1/10 % EPIDURAL INFUSION (WH - ANES)
14.0000 mL/h | INTRAMUSCULAR | Status: DC | PRN
  Administered 2017-04-18: 14 mL/h via EPIDURAL
  Filled 2017-04-18: qty 100

## 2017-04-18 MED ORDER — SIMETHICONE 80 MG PO CHEW: 80.0000 mg | CHEWABLE_TABLET | ORAL | Status: DC | PRN

## 2017-04-18 MED ORDER — LIDOCAINE HCL (PF) 1 % IJ SOLN
INTRAMUSCULAR | Status: DC | PRN
  Administered 2017-04-18: 7 mL via EPIDURAL
  Administered 2017-04-18: 6 mL via EPIDURAL

## 2017-04-18 MED ORDER — DIPHENHYDRAMINE HCL 25 MG PO CAPS: 25.0000 mg | ORAL_CAPSULE | Freq: Four times a day (QID) | ORAL | Status: DC | PRN

## 2017-04-18 MED ORDER — ONDANSETRON HCL 4 MG/2ML IJ SOLN: 4.0000 mg | INTRAMUSCULAR | Status: DC | PRN

## 2017-04-18 MED ORDER — ONDANSETRON HCL 4 MG PO TABS: 4.0000 mg | ORAL_TABLET | ORAL | Status: DC | PRN

## 2017-04-18 MED ORDER — LIDOCAINE HCL (PF) 1 % IJ SOLN
30.0000 mL | INTRAMUSCULAR | Status: DC | PRN
  Filled 2017-04-18: qty 30

## 2017-04-18 MED ORDER — LACTATED RINGERS IV SOLN: 500.0000 mL | INTRAVENOUS | Status: DC | PRN

## 2017-04-18 MED ORDER — IBUPROFEN 600 MG PO TABS
600.0000 mg | ORAL_TABLET | Freq: Four times a day (QID) | ORAL | Status: DC
  Administered 2017-04-18 – 2017-04-20 (×7): 600 mg via ORAL
  Filled 2017-04-18 (×8): qty 1

## 2017-04-18 MED ORDER — TETANUS-DIPHTH-ACELL PERTUSSIS 5-2.5-18.5 LF-MCG/0.5 IM SUSP: 0.5000 mL | Freq: Once | INTRAMUSCULAR | Status: DC

## 2017-04-18 MED ORDER — LACTATED RINGERS IV SOLN
INTRAVENOUS | Status: DC
  Administered 2017-04-18: 07:00:00 via INTRAVENOUS

## 2017-04-18 MED ORDER — EPHEDRINE 5 MG/ML INJ
10.0000 mg | INTRAVENOUS | Status: DC | PRN
Start: 2017-04-18 — End: 2017-04-20
  Filled 2017-04-18: qty 2

## 2017-04-18 MED ORDER — BENZOCAINE-MENTHOL 20-0.5 % EX AERO
1.0000 "application " | INHALATION_SPRAY | CUTANEOUS | Status: DC | PRN
  Filled 2017-04-18: qty 56

## 2017-04-18 MED ORDER — COCONUT OIL OIL
1.0000 "application " | TOPICAL_OIL | Status: DC | PRN
  Filled 2017-04-18: qty 120

## 2017-04-18 MED ORDER — SOD CITRATE-CITRIC ACID 500-334 MG/5ML PO SOLN: 30.0000 mL | ORAL | Status: DC | PRN

## 2017-04-18 MED ORDER — ZOLPIDEM TARTRATE 5 MG PO TABS
5.0000 mg | ORAL_TABLET | Freq: Every evening | ORAL | Status: DC | PRN
Start: 2017-04-18 — End: 2017-04-20

## 2017-04-18 MED ORDER — LACTATED RINGERS IV SOLN: INTRAVENOUS | Status: DC

## 2017-04-18 MED ORDER — PRENATAL MULTIVITAMIN CH
1.0000 | ORAL_TABLET | Freq: Every day | ORAL | Status: DC
  Administered 2017-04-19 – 2017-04-20 (×2): 1 via ORAL
  Filled 2017-04-18 (×2): qty 1

## 2017-04-18 NOTE — H&P (Signed)
Jacqueline Brown is a 26 y.o. female presenting for painful regular contractions, some bloody show.  She reports good fetal movement Her prenatal course has been uncomplicated.   OB History    Gravida Para Term Preterm AB Living   2       1 0   SAB TAB Ectopic Multiple Live Births       1         Past Medical History:  Diagnosis Date  . Allergy   . Anxiety   . Depression   . Ectopic pregnancy July 2013   methotrexate x2  . HSV (herpes simplex virus) anogenital infection   . Urinary tract infection    Past Surgical History:  Procedure Laterality Date  . NO PAST SURGERIES     Family History: family history includes Aneurysm in her mother; Breast cancer in her paternal aunt; Heart disease in her father and maternal aunt; Osteoporosis in her maternal aunt, maternal grandmother, and mother; Ovarian cancer in her maternal aunt. Social History:  reports that she has never smoked. She has never used smokeless tobacco. She reports that she drinks alcohol. She reports that she does not use drugs.     Maternal Diabetes: No Genetic Screening: Normal Maternal Ultrasounds/Referrals: Normal Fetal Ultrasounds or other Referrals:  Other:  Anatomy scan normal  Maternal Substance Abuse:  No Significant Maternal Medications:  Meds include: Zoloft Significant Maternal Lab Results:  Lab values include: Group B Strep negative Other Comments:  None  ROS History Dilation: 5 Effacement (%): 100 Station: -1 Exam by:: Lujean RaveLauren McDaniel RN Blood pressure 117/60, pulse 70, temperature 98.3 F (36.8 C), temperature source Oral, resp. rate 18, height 5\' 6"  (1.676 m), weight 89.8 kg (198 lb). Exam Physical Exam  Prenatal labs: ABO, Rh: --/--/O POS (06/06 0710) Antibody: NEG (06/06 0710) Rubella: Immune (11/17 0000) RPR: Nonreactive (11/17 0000)  HBsAg: Negative (11/17 0000)  HIV: Non-reactive (11/17 0000)  GBS:     Assessment/Plan: 26 yo G2P0010 at 39 weeks 6 days in active labor My check this  am 8-9 / 100/0 AROM performed Epidural Continuous monitoring Anticipate NSVD  Jazlin Tapscott STACIA 04/18/2017, 9:25 AM

## 2017-04-18 NOTE — MAU Note (Signed)
SAYS FEELS UC  , VE IN OFFIC E YESTERDAY  3 CM- STRIPPED MEMBRANES.  GBS- NEG.       HAD  POSITIVE BLOOD  FOR HSV2 - TAKES VALTREX- NEVER HAD AN OUTBREAK

## 2017-04-18 NOTE — Progress Notes (Signed)
Delivery of live viable female by Dr Mora ApplPinn. APGARS 9,9

## 2017-04-18 NOTE — Anesthesia Procedure Notes (Signed)
Epidural Patient location during procedure: OB Start time: 04/18/2017 7:59 AM End time: 04/18/2017 8:02 AM  Staffing Anesthesiologist: Leilani AbleHATCHETT, Tya Haughey Performed: anesthesiologist   Preanesthetic Checklist Completed: patient identified, surgical consent, pre-op evaluation, timeout performed, IV checked, risks and benefits discussed and monitors and equipment checked  Epidural Patient position: sitting Prep: site prepped and draped and DuraPrep Patient monitoring: continuous pulse ox and blood pressure Approach: midline Location: L3-L4 Injection technique: LOR air  Needle:  Needle type: Tuohy  Needle gauge: 17 G Needle length: 9 cm and 9 Needle insertion depth: 6 cm Catheter type: closed end flexible Catheter size: 19 Gauge Catheter at skin depth: 11 cm Test dose: negative and Other  Assessment Sensory level: T9 Events: blood not aspirated, injection not painful, no injection resistance, negative IV test and no paresthesia  Additional Notes Reason for block:procedure for pain

## 2017-04-18 NOTE — Anesthesia Preprocedure Evaluation (Signed)
Anesthesia Evaluation  Patient identified by MRN, date of birth, ID band Patient awake    Reviewed: Allergy & Precautions, H&P , NPO status , Patient's Chart, lab work & pertinent test results  Airway Mallampati: II  TM Distance: >3 FB Neck ROM: full    Dental no notable dental hx.    Pulmonary neg pulmonary ROS,    Pulmonary exam normal breath sounds clear to auscultation       Cardiovascular negative cardio ROS Normal cardiovascular exam Rhythm:regular Rate:Normal     Neuro/Psych negative neurological ROS     GI/Hepatic negative GI ROS, Neg liver ROS,   Endo/Other  negative endocrine ROS  Renal/GU negative Renal ROS     Musculoskeletal   Abdominal (+) + obese,   Peds  Hematology negative hematology ROS (+)   Anesthesia Other Findings   Reproductive/Obstetrics (+) Pregnancy                             Anesthesia Physical Anesthesia Plan  ASA: II  Anesthesia Plan: Epidural   Post-op Pain Management:    Induction:   PONV Risk Score and Plan:   Airway Management Planned:   Additional Equipment:   Intra-op Plan:   Post-operative Plan:   Informed Consent: I have reviewed the patients History and Physical, chart, labs and discussed the procedure including the risks, benefits and alternatives for the proposed anesthesia with the patient or authorized representative who has indicated his/her understanding and acceptance.     Plan Discussed with:   Anesthesia Plan Comments:         Anesthesia Quick Evaluation

## 2017-04-18 NOTE — Anesthesia Pain Management Evaluation Note (Signed)
  CRNA Pain Management Visit Note  Patient: Jacqueline Brown, 26 y.o., female  "Hello I am a member of the anesthesia team at [facility]. We have an anesthesia team available at all times to provide care throughout the hospital, including epidural management and anesthesia for C-section. I don't know your plan for the delivery whether it a natural birth, water birth, IV sedation, nitrous supplementation, doula or epidural, but we want to meet your pain goals."   1.Was your pain managed to your expectations on prior hospitalizations?   Yes   2.What is your expectation for pain management during this hospitalization?     Epidural  3.How can we help you reach that goal? ANMD called to place epidural  Record the patient's initial score and the patient's pain goal.   Pain: 10  Pain Goal: 10 [facility] wants you to be able to say your pain was always managed very well.  Jacqueline Brown 04/18/2017

## 2017-04-18 NOTE — Lactation Note (Signed)
This note was copied from a baby's chart. Lactation Consultation Note  Patient Name: Jacqueline Brown: 04/18/2017 Reason for consult: Initial assessment Baby at 8 hr of life. Upon entry baby was cueing and mom requested help. Mom has compressible breast and everted nipples. Baby has a nice gape, can extend tongue over slightly over gum ridge but tongue has a heart shaped tip and angles down with extension. Baby has a noticeable lingual frenulum with an anterior insertion point. Baby can not lift tongue to roof, has porr lateralization of tongue, and tongue quivers after short bursts of sucking. Discussed baby behavior, feeding frequency, tummy time, suck training, baby belly size, voids, wt loss, breast changes, and nipple care. Demonstrated manual expression, colostrum noted bilaterally, spoon at bedside. Given lactation handouts. Aware of OP services and support group. Mom will offer the breast on demand 8+/24hr, post express, and offer expressed milk per volume guidelines. Mom will vigilant about nipple care and maintain a deep latch. Parents will do suck training and tummy time throughout the day as tolerated.      Maternal Data Has patient been taught Hand Expression?: Yes Does the patient have breastfeeding experience prior to this delivery?: No  Feeding Feeding Type: Breast Fed Length of feed: 20 min  LATCH Score/Interventions Latch: Repeated attempts needed to sustain latch, nipple held in mouth throughout feeding, stimulation needed to elicit sucking reflex. Intervention(s): Adjust position;Assist with latch;Breast compression  Audible Swallowing: Spontaneous and intermittent  Type of Nipple: Everted at rest and after stimulation  Comfort (Breast/Nipple): Filling, red/small blisters or bruises, mild/mod discomfort  Problem noted: Mild/Moderate discomfort;Severe discomfort Interventions (Mild/moderate discomfort): Hand expression  Hold (Positioning): Full assist,  staff holds infant at breast Intervention(s): Position options;Support Pillows  LATCH Score: 6  Lactation Tools Discussed/Used     Consult Status Consult Status: Follow-up Brown: 04/19/17 Follow-up type: In-patient    Rulon Eisenmengerlizabeth E Savior Himebaugh 04/18/2017, 6:53 PM

## 2017-04-19 LAB — CBC
HCT: 34 % — ABNORMAL LOW (ref 36.0–46.0)
Hemoglobin: 11.8 g/dL — ABNORMAL LOW (ref 12.0–15.0)
MCH: 30.5 pg (ref 26.0–34.0)
MCHC: 34.7 g/dL (ref 30.0–36.0)
MCV: 87.9 fL (ref 78.0–100.0)
PLATELETS: 214 10*3/uL (ref 150–400)
RBC: 3.87 MIL/uL (ref 3.87–5.11)
RDW: 15.1 % (ref 11.5–15.5)
WBC: 8.5 10*3/uL (ref 4.0–10.5)

## 2017-04-19 NOTE — Anesthesia Postprocedure Evaluation (Signed)
Anesthesia Post Note  Patient: Jacqueline Brown  Procedure(s) Performed: * No procedures listed *     Patient location during evaluation: Mother Baby Anesthesia Type: Epidural Level of consciousness: awake Pain management: pain level controlled Vital Signs Assessment: post-procedure vital signs reviewed and stable Respiratory status: spontaneous breathing Cardiovascular status: stable Postop Assessment: patient able to bend at knees, no headache and no backache Anesthetic complications: no    Last Vitals:  Vitals:   04/18/17 1835 04/19/17 0200  BP: 124/67 (!) 110/54  Pulse: 85 71  Resp: 18 18  Temp: 36.9 C 36.9 C    Last Pain:  Vitals:   04/19/17 1300  TempSrc:   PainSc: 0-No pain   Pain Goal: Patients Stated Pain Goal: 3 (04/18/17 1445)               Chesley Veasey JR,JOHN Susann GivensFRANKLIN

## 2017-04-19 NOTE — Progress Notes (Signed)
Patient is doing well.  She is ambulating, voiding, tolerating PO.  Pain control is good.  Lochia is appropriate  Vitals:   04/18/17 1335 04/18/17 1445 04/18/17 1835 04/19/17 0200  BP: 126/64 121/69 124/67 (!) 110/54  Pulse: 74 81 85 71  Resp: 18 18 18 18   Temp: 98.7 F (37.1 C) 98.7 F (37.1 C) 98.5 F (36.9 C) 98.4 F (36.9 C)  TempSrc: Oral Oral Axillary Oral  SpO2: 100% 100% 98%   Weight:      Height:        NAD Fundus firm Ext: no edema  Lab Results  Component Value Date   WBC 8.5 04/19/2017   HGB 11.8 (L) 04/19/2017   HCT 34.0 (L) 04/19/2017   MCV 87.9 04/19/2017   PLT 214 04/19/2017    --/--/O POS (06/06 0710)/RImmune  A/P 25 y.o. G2P0010 PPD#1. Routine care.   Expect d/c tomorrow. Circ as outpatient at peds office    [facility]ning[facility]ANNA GEFFEL ShippingportLARK

## 2017-04-19 NOTE — Lactation Note (Signed)
This note was copied from a baby's chart. Lactation Consultation Note  Patient Name: Jacqueline Brown [id number] Date: 04/19/2017 Reason for consult: Follow-up assessment;Breast/nipple pain;Infant weight loss;Hyperbilirubinemia (4% weight loss , per mom to sore to latch / see LC note ) baby is 63 hours old , initially to the breast. Per mom due to nipple soreness for now is just pumping with the DEBP #27 flange, and feels better than the #24. LC assessed both nipples, areas of bruising noted, no breakdown noted. Mom feels comfortable with hand expressing and the most EBM has been 2 ml. Per mom has pumped x2 so far , pump was just  set up today.  LC instructed mom on the use of hand pump for when the baby is re-latching, comfort gels, and shells. Mom put the shells  On while the LC was in the room and per mom comfortable.  LC last fed at 1420 - 50 ml , baby sound asleep. @ 18 hours Serum Bilirubin 10 , baby on double photo, and sleeping.  Follow up 6/8 I/P .      Maternal Data Has patient been taught Hand Expression?: Yes (per mom had hand expressed off 1 ml , and feels comfortable )  Feeding Feeding Type:  (perm om lastfed the baby at 1700 )  LATCH Score/Interventions                      Lactation Tools Discussed/Used Tools: Shells;Pump;Comfort gels;Flanges Flange Size: 27 (from the DEBP kit ) Shell Type: Inverted Breast pump type: Double-Electric Breast Pump (also and hand pump for when the baby - re-latches ) WIC Program: No Pump Review: Setup, frequency, and cleaning Initiated by::  (reviewed ) Date initiated:: 04/19/17   Consult Status Consult Status: Follow-up Date: 04/20/17 Follow-up type: In-patient    [place] 04/19/2017, 5:21 PM

## 2017-04-19 NOTE — Progress Notes (Signed)
MOB was referred for history of depression/anxiety. * Referral screened out by Clinical Social Worker because none of the following criteria appear to apply: ~ History of anxiety/depression during this pregnancy, or of post-partum depression. ~ Diagnosis of anxiety and/or depression within last 3 years OR * MOB's symptoms currently being treated with medication and/or therapy.  CSW completed chart review and MOB is currently on Zoloft and is in counseling.   Please contact the Clinical Social Worker if needs arise, or if MOB requests.  Madalene Mickler Boyd-Gilyard, MSW, LCSW Clinical Social Work [phone] 

## 2017-04-20 NOTE — Progress Notes (Signed)
Patient is eating, ambulating, voiding.  Pain control is good.  Vitals:   04/18/17 1835 04/19/17 0200 04/19/17 1741 04/20/17 0612  BP: 124/67 (!) 110/54 120/68 112/67  Pulse: 85 71 74 68  Resp: 18 18 18 18   Temp: 98.5 F (36.9 C) 98.4 F (36.9 C) 97.7 F (36.5 C) 98.4 F (36.9 C)  TempSrc: Axillary Oral Oral Axillary  SpO2: 98%     Weight:      Height:        Fundus firm Perineum without swelling.  Lab Results  Component Value Date   WBC 8.5 04/19/2017   HGB 11.8 (L) 04/19/2017   HCT 34.0 (L) 04/19/2017   MCV 87.9 04/19/2017   PLT 214 04/19/2017    --/--/O POS (06/06 0710)/RI  A/P Post partum day 2.  Routine care.  Expect d/c today.    Jacqueline Brown A

## 2017-04-20 NOTE — Lactation Note (Signed)
This note was copied from a baby's chart. Lactation Consultation Note  Patient Name: Jacqueline Lawanna KobusMitzi Pernell AVWUJ'WToday's Date: 04/20/2017 Reason for consult: Follow-up assessment;Breast/nipple pain;Other (Comment) (per mom nipple soreness improved/ Double photo D/C this am. ) F/U  Serum Bili for this afternoon , D/C pending.  Per mom soreness has improved with pumping, shells, but not ready to re-latch yet.  Per mom has been pumping regularly and getting 1-2 ml.  LC stressed the importance of consistent pumping around th clock , at least 8 x's for 15 - 20 mins  At a pumping session.  LC encouraged when the milk comes in to consider re-latching , may have to give the baby an appetizer of EBM or formula  With a bottle ( what the baby has got'en used to ) so the baby goes  To the breast calm. Sore nipple and engorgement prevention and tx reviewed . Per mom has  DEBP Medela at home.  LC also recommended for mom to consider a F/U with LC O/P appt. And offered to make appt, for next  Week and mom requested to call back for appt.  Mother informed of post-discharge support and given phone number to the lactation department, including services for phone call assistance; out-patient appointments; and breastfeeding support group. List of other breastfeeding resources in the community given in the handout. Encouraged mother to call for problems or concerns related to breastfeeding.    Maternal Data    Feeding Feeding Type:  (mom feeding baby a bottle )  LATCH Score/Interventions                Intervention(s): Breastfeeding basics reviewed     Lactation Tools Discussed/Used Tools: Pump;Shells;Comfort gels Flange Size: 27 Shell Type: Inverted Breast pump type: Double-Electric Breast Pump   Consult Status Consult Status: Complete Date: 04/20/17    Kathrin GreathouseMargaret Ann Bastien Strawser 04/20/2017, 11:54 AM

## 2017-04-20 NOTE — Discharge Summary (Signed)
Obstetric Discharge Summary Reason for Admission: onset of labor Prenatal Procedures: none Intrapartum Procedures: spontaneous vaginal delivery Postpartum Procedures: none Complications-Operative and Postpartum: none Hemoglobin  Date Value Ref Range Status  04/19/2017 11.8 (L) 12.0 - 15.0 g/dL Final   HCT  Date Value Ref Range Status  04/19/2017 34.0 (L) 36.0 - 46.0 % Final    Discharge Diagnoses: Term Pregnancy-delivered  Discharge Information: Date: 04/20/2017 Activity: pelvic rest Diet: routine Medications: Ibuprofen Condition: stable Instructions: refer to practice specific booklet Discharge to: home Follow-up Information    Essie HartPinn, Walda, MD Follow up in 4 week(s).   Specialty:  Obstetrics and Gynecology Contact information: [address] [place] [address] [phone]           Newborn Data: Live born female  Birth Weight: 6 lb 5.1 oz (2865 g) APGAR: 9, 9  Home with mother.  Milledge Gerding A 04/20/2017, 7:52 AM

## 2017-04-26 ENCOUNTER — Inpatient Hospital Stay (HOSPITAL_COMMUNITY)
Admission: RE | Admit: 2017-04-26 | Payer: 59 | Source: Ambulatory Visit | Attending: Obstetrics & Gynecology | Admitting: Obstetrics & Gynecology

## 2017-04-26 ENCOUNTER — Inpatient Hospital Stay (HOSPITAL_COMMUNITY): Admission: RE | Admit: 2017-04-26 | Payer: 59 | Source: Ambulatory Visit

## 2017-05-18 DIAGNOSIS — Z124 Encounter for screening for malignant neoplasm of cervix: Secondary | ICD-10-CM | POA: Diagnosis not present

## 2017-07-04 DIAGNOSIS — Z30430 Encounter for insertion of intrauterine contraceptive device: Secondary | ICD-10-CM | POA: Diagnosis not present

## 2017-07-04 DIAGNOSIS — Z3202 Encounter for pregnancy test, result negative: Secondary | ICD-10-CM | POA: Diagnosis not present

## 2017-07-26 DIAGNOSIS — F422 Mixed obsessional thoughts and acts: Secondary | ICD-10-CM | POA: Diagnosis not present

## 2017-08-02 MED FILL — ADDERALL XR 15 MG CAP SA: 15 | 30 days supply | Qty: 30 | Fill #0

## 2017-08-02 MED FILL — SERTRALINE HCL 50 MG TABLET: 50 | 30 days supply | Qty: 60 | Fill #0

## 2017-08-02 MED FILL — LORazepam 0.5 MG TABS: 0.5 | 15 days supply | Qty: 30 | Fill #0

## 2017-09-17 ENCOUNTER — Encounter (HOSPITAL_COMMUNITY): Payer: Self-pay

## 2017-09-17 DIAGNOSIS — F422 Mixed obsessional thoughts and acts: Secondary | ICD-10-CM | POA: Diagnosis not present

## 2017-09-17 DIAGNOSIS — F900 Attention-deficit hyperactivity disorder, predominantly inattentive type: Secondary | ICD-10-CM | POA: Diagnosis not present

## 2017-09-27 MED FILL — ADDERALL XR 15 MG CAP SA: 15 | 30 days supply | Qty: 30 | Fill #0

## 2017-09-27 MED FILL — SERTRALINE HCL 50 MG TABLET: 50 | 30 days supply | Qty: 45 | Fill #0

## 2017-10-29 MED FILL — SERTRALINE HCL 50 MG TABLET: 50 | 30 days supply | Qty: 45 | Fill #1

## 2017-10-29 MED FILL — ADDERALL XR 15 MG CAP SA: 15 | 30 days supply | Qty: 30 | Fill #0

## 2017-11-28 MED FILL — SERTRALINE HCL 50 MG TABLET: 50 | 30 days supply | Qty: 45 | Fill #2

## 2017-12-10 DIAGNOSIS — F422 Mixed obsessional thoughts and acts: Secondary | ICD-10-CM | POA: Diagnosis not present

## 2017-12-11 MED FILL — SERTRALINE HCL 100 MG TAB: 100 | 30 days supply | Qty: 60 | Fill #0

## 2017-12-11 MED FILL — LORazepam 0.5 MG TABS: 0.5 | 15 days supply | Qty: 30 | Fill #0

## 2017-12-11 MED FILL — ADDERALL XR 15 MG CAP SA: 15 | 30 days supply | Qty: 30 | Fill #0

## 2017-12-20 DIAGNOSIS — F422 Mixed obsessional thoughts and acts: Secondary | ICD-10-CM | POA: Diagnosis not present

## 2017-12-25 MED FILL — VALACYCLOVIR HCL 500 MG TAB: 500 | 90 days supply | Qty: 90 | Fill #1

## 2018-01-10 DIAGNOSIS — F422 Mixed obsessional thoughts and acts: Secondary | ICD-10-CM | POA: Diagnosis not present

## 2018-01-14 MED FILL — ADDERALL XR 15 MG CAP SA: 15 | 30 days supply | Qty: 30 | Fill #0

## 2018-01-14 MED FILL — SERTRALINE HCL 100 MG TAB: 100 | 30 days supply | Qty: 60 | Fill #1

## 2018-02-18 MED FILL — SERTRALINE HCL 100 MG TAB: 100 | 30 days supply | Qty: 60 | Fill #0

## 2018-03-07 DIAGNOSIS — F422 Mixed obsessional thoughts and acts: Secondary | ICD-10-CM | POA: Diagnosis not present

## 2018-03-18 MED FILL — ADDERALL XR 15 MG CAP SA: 15 | 30 days supply | Qty: 30 | Fill #0

## 2018-03-26 MED FILL — SERTRALINE HCL 100 MG TAB: 100 | 30 days supply | Qty: 60 | Fill #1

## 2018-04-04 DIAGNOSIS — F422 Mixed obsessional thoughts and acts: Secondary | ICD-10-CM | POA: Diagnosis not present

## 2018-04-17 ENCOUNTER — Encounter: Payer: Self-pay | Admitting: Family Medicine

## 2018-04-17 ENCOUNTER — Ambulatory Visit (INDEPENDENT_AMBULATORY_CARE_PROVIDER_SITE_OTHER): Payer: 59 | Admitting: Family Medicine

## 2018-04-17 VITALS — BP 129/70 | HR 88 | Temp 98.4°F | Ht 66.0 in | Wt 164.0 lb

## 2018-04-17 DIAGNOSIS — E559 Vitamin D deficiency, unspecified: Secondary | ICD-10-CM | POA: Diagnosis not present

## 2018-04-17 DIAGNOSIS — F329 Major depressive disorder, single episode, unspecified: Secondary | ICD-10-CM | POA: Insufficient documentation

## 2018-04-17 DIAGNOSIS — F419 Anxiety disorder, unspecified: Secondary | ICD-10-CM

## 2018-04-17 DIAGNOSIS — I951 Orthostatic hypotension: Secondary | ICD-10-CM | POA: Insufficient documentation

## 2018-04-17 DIAGNOSIS — F32A Depression, unspecified: Secondary | ICD-10-CM

## 2018-04-17 DIAGNOSIS — Z0000 Encounter for general adult medical examination without abnormal findings: Secondary | ICD-10-CM | POA: Diagnosis not present

## 2018-04-17 DIAGNOSIS — Z131 Encounter for screening for diabetes mellitus: Secondary | ICD-10-CM

## 2018-04-17 LAB — BASIC METABOLIC PANEL
BUN: 15 mg/dL (ref 6–23)
CALCIUM: 9.1 mg/dL (ref 8.4–10.5)
CHLORIDE: 102 meq/L (ref 96–112)
CO2: 28 meq/L (ref 19–32)
Creatinine, Ser: 0.75 mg/dL (ref 0.40–1.20)
GFR: 97.16 mL/min (ref >60.00)
Glucose, Bld: 78 mg/dL (ref 70–99)
Potassium: 4.6 mEq/L (ref 3.5–5.1)
SODIUM: 138 meq/L (ref 135–145)

## 2018-04-17 LAB — CBC
HEMATOCRIT: 45 % (ref 36.0–46.0)
HEMOGLOBIN: 15.4 g/dL — AB (ref 12.0–15.0)
MCHC: 34.3 g/dL (ref 30.0–36.0)
MCV: 86.8 fl (ref 78.0–100.0)
PLATELETS: 284 10*3/uL (ref 150.0–400.0)
RBC: 5.18 Mil/uL — AB (ref 3.87–5.11)
RDW: 14.1 % (ref 11.5–15.5)
WBC: 4.3 10*3/uL (ref 4.0–10.5)

## 2018-04-17 LAB — VITAMIN D 25 HYDROXY (VIT D DEFICIENCY, FRACTURES): VITD: 29.54 ng/mL — AB (ref 30.00–100.00)

## 2018-04-17 LAB — HEMOGLOBIN A1C: HEMOGLOBIN A1C: 5.2 % (ref 4.6–6.5)

## 2018-04-17 LAB — TSH: TSH: 1.35 u[IU]/mL (ref 0.35–4.50)

## 2018-04-17 LAB — T4, FREE: Free T4: 0.76 ng/dL (ref 0.60–1.60)

## 2018-04-17 NOTE — Patient Instructions (Addendum)
Preventive Care 18-39 Years, Female Preventive care refers to lifestyle choices and visits with your health care provider that can promote health and wellness. What does preventive care include?  A yearly physical exam. This is also called an annual well check.  Dental exams once or twice a year.  Routine eye exams. Ask your health care provider how often you should have your eyes checked.  Personal lifestyle choices, including: ? Daily care of your teeth and gums. ? Regular physical activity. ? Eating a healthy diet. ? Avoiding tobacco and drug use. ? Limiting alcohol use. ? Practicing safe sex. ? Taking vitamin and mineral supplements as recommended by your health care provider. What happens during an annual well check? The services and screenings done by your health care provider during your annual well check will depend on your age, overall health, lifestyle risk factors, and family history of disease. Counseling Your health care provider may ask you questions about your:  Alcohol use.  Tobacco use.  Drug use.  Emotional well-being.  Home and relationship well-being.  Sexual activity.  Eating habits.  Work and work Statistician.  Method of birth control.  Menstrual cycle.  Pregnancy history.  Screening You may have the following tests or measurements:  Height, weight, and BMI.  Diabetes screening. This is done by checking your blood sugar (glucose) after you have not eaten for a while (fasting).  Blood pressure.  Lipid and cholesterol levels. These may be checked every 5 years starting at age 66.  Skin check.  Hepatitis C blood test.  Hepatitis B blood test.  Sexually transmitted disease (STD) testing.  BRCA-related cancer screening. This may be done if you have a family history of breast, ovarian, tubal, or peritoneal cancers.  Pelvic exam and Pap test. This may be done every 3 years starting at age 40. Starting at age 59, this may be done every 5  years if you have a Pap test in combination with an HPV test.  Discuss your test results, treatment options, and if necessary, the need for more tests with your health care provider. Vaccines Your health care provider may recommend certain vaccines, such as:  Influenza vaccine. This is recommended every year.  Tetanus, diphtheria, and acellular pertussis (Tdap, Td) vaccine. You may need a Td booster every 10 years.  Varicella vaccine. You may need this if you have not been vaccinated.  HPV vaccine. If you are 69 or younger, you may need three doses over 6 months.  Measles, mumps, and rubella (MMR) vaccine. You may need at least one dose of MMR. You may also need a second dose.  Pneumococcal 13-valent conjugate (PCV13) vaccine. You may need this if you have certain conditions and were not previously vaccinated.  Pneumococcal polysaccharide (PPSV23) vaccine. You may need one or two doses if you smoke cigarettes or if you have certain conditions.  Meningococcal vaccine. One dose is recommended if you are age 27-21 years and a first-year college student living in a residence hall, or if you have one of several medical conditions. You may also need additional booster doses.  Hepatitis A vaccine. You may need this if you have certain conditions or if you travel or work in places where you may be exposed to hepatitis A.  Hepatitis B vaccine. You may need this if you have certain conditions or if you travel or work in places where you may be exposed to hepatitis B.  Haemophilus influenzae type b (Hib) vaccine. You may need this if  you have certain risk factors.  Talk to your health care provider about which screenings and vaccines you need and how often you need them. This information is not intended to replace advice given to you by your health care provider. Make sure you discuss any questions you have with your health care provider. Document Released: 12/26/2001 Document Revised: 07/19/2016  Document Reviewed: 08/31/2015 Elsevier Interactive Patient Education  2018 [employer].  Orthostatic Hypotension Orthostatic hypotension is a sudden drop in blood pressure that happens when you quickly change positions, such as when you get up from a seated or lying position. Blood pressure is a measurement of how strongly, or weakly, your blood is pressing against the walls of your arteries. Arteries are blood vessels that carry blood from your heart throughout your body. When blood pressure is too low, you may not get enough blood to your brain or to the rest of your organs. This can cause weakness, light-headedness, rapid heartbeat, and fainting. This can last for just a few seconds or for up to a few minutes. Orthostatic hypotension is usually not a serious problem. However, if it happens frequently or gets worse, it may be a sign of something more serious. What are the causes? This condition may be caused by:  Sudden changes in posture, such as standing up quickly after you have been sitting or lying down.  Blood loss.  Loss of body fluids (dehydration).  Heart problems.  Hormone (endocrine) problems.  Pregnancy.  Severe infection.  Lack of certain nutrients.  Severe allergic reactions (anaphylaxis).  Certain medicines, such as blood pressure medicine or medicines that make the body lose excess fluids (diuretics). Sometimes, this condition can be caused by not taking medicine as directed, such as taking too much of a certain medicine.  What increases the risk? Certain factors can make you more likely to develop orthostatic hypotension, including:  Age. Risk increases as you get older.  Conditions that affect the heart or the central nervous system.  Taking certain medicines, such as blood pressure medicine or diuretics.  Being pregnant.  What are the signs or symptoms? Symptoms of this condition may include:  Weakness.  Light-headedness.  Dizziness.  Blurred  vision.  Fatigue.  Rapid heartbeat.  Fainting, in severe cases.  How is this diagnosed? This condition is diagnosed based on:  Your medical history.  Your symptoms.  Your blood pressure measurement. Your health care provider will check your blood pressure when you are: ? Lying down. ? Sitting. ? Standing.  A blood pressure reading is recorded as two numbers, such as "120 over 80" (or 120/80). The first ("top") number is called the systolic pressure. It is a measure of the pressure in your arteries as your heart beats. The second ("bottom") number is called the diastolic pressure. It is a measure of the pressure in your arteries when your heart relaxes between beats. Blood pressure is measured in a unit called mm Hg. Healthy blood pressure for adults is 120/80. If your blood pressure is below 90/60, you may be diagnosed with hypotension. Other information or tests that may be used to diagnose orthostatic hypotension include:  Your other vital signs, such as your heart rate and temperature.  Blood tests.  Tilt table test. For this test, you will be safely secured to a table that moves you from a lying position to an upright position. Your heart rhythm and blood pressure will be monitored during the test.  How is this treated? Treatment for this condition  may include:  Changing your diet. This may involve eating more salt (sodium) or drinking more water.  Taking medicines to raise your blood pressure.  Changing the dosage of certain medicines you are taking that might be lowering your blood pressure.  Wearing compression stockings. These stockings help to prevent blood clots and reduce swelling in your legs.  In some cases, you may need to go to the hospital for:  Fluid replacement. This means you will receive fluids through an IV tube.  Blood replacement. This means you will receive donated blood through an IV tube (transfusion).  Treating an infection or heart problems,  if this applies.  Monitoring. You may need to be monitored while medicines that you are taking wear off.  Follow these instructions at home: Eating and drinking   Drink enough fluid to keep your urine clear or pale yellow.  Eat a healthy diet and follow instructions from your health care provider about eating or drinking restrictions. A healthy diet includes: ? Fresh fruits and vegetables. ? Whole grains. ? Lean meats. ? Low-fat dairy products.  Eat extra salt only as directed. Do not add extra salt to your diet unless your health care provider told you to do that.  Eat frequent, small meals.  Avoid standing up suddenly after eating. Medicines  Take over-the-counter and prescription medicines only as told by your health care provider. ? Follow instructions from your health care provider about changing the dosage of your current medicines, if this applies. ? Do not stop or adjust any of your medicines on your own. General instructions  Wear compression stockings as told by your health care provider.  Get up slowly from lying down or sitting positions. This gives your blood pressure a chance to adjust.  Avoid hot showers and excessive heat as directed by your health care provider.  Return to your normal activities as told by your health care provider. Ask your health care provider what activities are safe for you.  Do not use any products that contain nicotine or tobacco, such as cigarettes and e-cigarettes. If you need help quitting, ask your health care provider.  Keep all follow-up visits as told by your health care provider. This is important. Contact a health care provider if:  You vomit.  You have diarrhea.  You have a fever for more than 2-3 days.  You feel more thirsty than usual.  You feel weak and tired. Get help right away if:  You have chest pain.  You have a fast or irregular heartbeat.  You develop numbness in any part of your body.  You cannot  move your arms or your legs.  You have trouble speaking.  You become sweaty or feel lightheaded.  You faint.  You feel short of breath.  You have trouble staying awake.  You feel confused. This information is not intended to replace advice given to you by your health care provider. Make sure you discuss any questions you have with your health care provider. Document Released: 10/20/2002 Document Revised: 07/18/2016 Document Reviewed: 04/21/2016 Elsevier Interactive Patient Education  2018 [employer].

## 2018-04-17 NOTE — Progress Notes (Signed)
Patient presents to clinic today to establish care.  SUBJECTIVE: PMH: Pt is a 27 yo with pmh sig for anxiety, depression, h/o vitamin d deficiency, orthostatic hypotension, exercise induced asthma.  Pt is followed by OB/GYN.  History of vitamin D deficiency: -Patient states this has been low in the past but she has not had a checked in a while -Patient does endorse decreased energy, though does note she has a toddler at home.  Anxiety/depression: -Patient has been dealing with this for years. -Currently taking Zoloft 200 mg daily -Patient is followed at [facility] by Melony Overlyeresa Hurst. -Patient states her mood is good.  Her sleep varies and her energy is decreased.  Orthostatic hypotension: -Patient states she has been dealing with this for a while. -It has become progressively worse as she now has symptoms if she bends over, stands up too fast, or squats. -Patient is currently drinking maybe 2 bottles of water per day.  Allergies: Sulfa-rash  Past surgical history: None  Social history: Patient is married.  She has a toddler at home.  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  Patient denies tobacco and drug use.  Patient endorses social alcohol use.  Family medical history: Mom-deceased 2/2 brain aneurysm, alcohol abuse, asthma, depression, drug abuse, early death, miscarriage.  Likely had anxiety and depression. Dad-alive, alcohol abuse, diabetes, hearing loss, MI, heart disease, HLD, HTN MGF-brain aneurysm MGM-arthritis  Health Maintenance: Immunizations --influenza vaccine 2019, tetanus shot 2018 PAP --05/2017 LMP 04/06/2018   Past Medical History:  Diagnosis Date  . Allergy   . Anxiety   . Depression   . Ectopic pregnancy July 2013   methotrexate x2  . HSV (herpes simplex virus) anogenital infection   . Urinary tract infection     Past Surgical History:  Procedure Laterality Date  . NO PAST SURGERIES      Current  Outpatient Medications on File Prior to Visit  Medication Sig Dispense Refill  . amphetamine-dextroamphetamine (ADDERALL XR) 15 MG 24 hr capsule Take 15 mg by mouth daily.    . sertraline (ZOLOFT) 50 MG tablet Take 50 mg by mouth daily. Pt REPORTS TAKING 200 MG CURRENTLY     No current facility-administered medications on file prior to visit.     Allergies  Allergen Reactions  . Sulfa Antibiotics Hives    Family History  Problem Relation Age of Onset  . Heart disease Father   . Osteoporosis Mother   . Aneurysm Mother   . Ovarian cancer Maternal Aunt   . Heart disease Maternal Aunt   . Osteoporosis Maternal Aunt   . Breast cancer Paternal Aunt   . Osteoporosis Maternal Grandmother   . Other Neg Hx     Social History   Socioeconomic History  . Marital status: Married    Spouse name: Not on file  . Number of children: Not on file  . Years of education: Not on file  . Highest education level: Not on file  Occupational History  . Occupation: CMA    Employer: [employer]  Social Needs  . Financial resource strain: Not on file  . Food insecurity:    Worry: Not on file    Inability: Not on file  . Transportation needs:    Medical: Not on file    Non-medical: Not on file  Tobacco Use  . Smoking status: Never Smoker  . Smokeless tobacco: Never Used  Substance and Sexual Activity  . Alcohol use:  Yes    Comment: socially   . Drug use: No  . Sexual activity: Yes  Lifestyle  . Physical activity:    Days per week: Not on file    Minutes per session: Not on file  . Stress: Not on file  Relationships  . Social connections:    Talks on phone: Not on file    Gets together: Not on file    Attends religious service: Not on file    Active member of club or organization: Not on file    Attends meetings of clubs or organizations: Not on file    Relationship status: Not on file  . Intimate partner violence:    Fear of current or ex partner: Not on file    Emotionally  abused: Not on file    Physically abused: Not on file    Forced sexual activity: Not on file  Other Topics Concern  . Not on file  Social History Narrative  . Not on file    ROS General: Denies fever, chills, night sweats, changes in weight, changes in appetite  + decreased energy HEENT: Denies headaches, ear pain, changes in vision, rhinorrhea, sore throat CV: Denies CP, palpitations, SOB, orthopnea Pulm: Denies SOB, cough, wheezing GI: Denies abdominal pain, nausea, vomiting, diarrhea, constipation GU: Denies dysuria, hematuria, frequency, vaginal discharge Msk: Denies muscle cramps, joint pains Neuro: Denies weakness, numbness, tingling Skin: Denies rashes, bruising Psych: Denies hallucinations  + anxiety and depression  BP 129/70 (BP Location: Left Arm, Patient Position: Sitting, Cuff Size: Normal)   Pulse 88   Temp 98.4 F (36.9 C) (Oral)   Ht 5\' 6"  (1.676 m)   Wt 164 lb (74.4 kg)   SpO2 99%   BMI 26.47 kg/m   Physical Exam Gen. Pleasant, well developed, well-nourished, in NAD HEENT - [place]/AT, PERRL, no scleral icterus, no nasal drainage, pharynx without erythema or exudate.  TMs normal bilaterally.  No cervical lymphadenopathy. Neck: No JVD, no thyromegaly Lungs: no use of accessory muscles, CTAB, no wheezes, rales or rhonchi Cardiovascular: RRR, No r/g/m, no peripheral edema Abdomen: BS present, soft, nontender,nondistended, no hepatosplenomegaly Musculoskeletal: No deformities, moves all four extremities, no cyanosis or clubbing, normal tone Neuro:  A&Ox3, CN II-XII intact, normal gait Skin:  Warm, dry, intact, no lesions Psych: normal affect, mood appropriate  No results found for this or any previous visit (from the past 2160 hour(s)).  Assessment/Plan: Well adult exam  -Anticipatory guidance given including wearing seatbelts, smoke detectors in the home, increasing physical activity, increasing p.o. intake of water and vegetables. -Pap up-to-date is followed  by OB/GYN. -We will obtain labs this visit -Next CPE in 1 year - Plan: Basic metabolic panel, CBC (no diff)  Vitamin D deficiency  - Plan: Vitamin D, 25-hydroxy  Screening for diabetes mellitus  - Plan: Hemoglobin A1c  Orthostatic hypotension  - Plan: TSH, T4, free, Hemoglobin A1c  Anxiety and depression -Continue current medications including Zoloft 200 mg -Continue follow-up with psychiatrist at [facility] -PHQ 9 score 3 -Gad 7 score 2  Follow-up PRN  Abbe Amsterdam, MD

## 2018-04-18 ENCOUNTER — Telehealth: Payer: Self-pay | Admitting: Family Medicine

## 2018-04-18 NOTE — Telephone Encounter (Signed)
Charted in result notes. 

## 2018-04-18 NOTE — Telephone Encounter (Signed)
Copied from CRM [id number]. Topic: Quick Communication - Lab Results >> Apr 18, 2018  1:07 PM [place], Ky BarbanNancy N, CMA wrote: Called patient to inform them of  lab results. When patient returns call, triage nurse may disclose results.

## 2018-04-26 MED FILL — SERTRALINE HCL 100 MG TAB: 100 | 30 days supply | Qty: 60 | Fill #2

## 2018-05-09 DIAGNOSIS — Z30431 Encounter for routine checking of intrauterine contraceptive device: Secondary | ICD-10-CM | POA: Diagnosis not present

## 2018-05-30 ENCOUNTER — Ambulatory Visit: Payer: 59 | Admitting: Family Medicine

## 2018-06-17 ENCOUNTER — Encounter: Payer: Self-pay | Admitting: Family Medicine

## 2018-06-17 ENCOUNTER — Ambulatory Visit (INDEPENDENT_AMBULATORY_CARE_PROVIDER_SITE_OTHER): Payer: 59

## 2018-06-17 ENCOUNTER — Ambulatory Visit (INDEPENDENT_AMBULATORY_CARE_PROVIDER_SITE_OTHER): Payer: 59 | Admitting: Family Medicine

## 2018-06-17 VITALS — BP 124/82 | HR 61 | Temp 98.5°F | Resp 12 | Ht 66.0 in | Wt 172.0 lb

## 2018-06-17 DIAGNOSIS — S022XXA Fracture of nasal bones, initial encounter for closed fracture: Secondary | ICD-10-CM

## 2018-06-17 DIAGNOSIS — S0992XA Unspecified injury of nose, initial encounter: Secondary | ICD-10-CM

## 2018-06-17 NOTE — Patient Instructions (Addendum)
A few things to remember from today's visit:   Blunt trauma of nose, initial encounter - Plan: DG Nasal Bones  Local ice for 48-72 hours intermittently. Monitor for worsening stuffy nose, new ecchymosis or nosebleed. You can take over-the-counter ibuprofen 400 to 600 mg 3 times per day with food for 5 to 7 days.  Follow-up with PCP as needed.  Please be sure medication list is accurate. If a new problem present, please set up appointment sooner than planned today.

## 2018-06-17 NOTE — Progress Notes (Signed)
ACUTE VISIT   HPI:  Chief Complaint  Patient presents with  . Nose pain/discomfort    hit in nose yesterday by toddler's head, nose swollen, slight headache and some dizziness    Jacqueline Brown is a 27 y.o. female, who is here today complaining of nose pain and edema after her son hit her nose while playing. Blunt trauma on anterior aspect of nose yesterday around 4:30 pm. Severe trauma right after and local edema an hour later.Pain radiated to upper teeth and frontal,periocular area. Frontal throbbing headache 3/10. Nose pain if she is not palpating bone 2/10, with palpation is severe. No associated visual changes,nausea,or vomiting.  No ecchymosis or nose bleed. + Nasal congestion and rhinorrhea.  Pain is better today. She has not taken analgesics.   Review of Systems  Constitutional: Negative for chills and fever.  HENT: Positive for congestion, postnasal drip and rhinorrhea. Negative for facial swelling, nosebleeds and sore throat.   Eyes: Negative for redness and visual disturbance.  Respiratory: Negative for shortness of breath and wheezing.   Gastrointestinal: Negative for nausea and vomiting.  Neurological: Positive for headaches. Negative for syncope, weakness and numbness.      Current Outpatient Medications on File Prior to Visit  Medication Sig Dispense Refill  . amphetamine-dextroamphetamine (ADDERALL XR) 15 MG 24 hr capsule Take 15 mg by mouth daily.    Marland Kitchen levonorgestrel (MIRENA, 52 MG,) 20 MCG/24HR IUD Mirena 20 mcg/24 hours (5 yrs) 52 mg intrauterine device  Take 1 device by intrauterine route.    Marland Kitchen LORazepam (ATIVAN) 0.5 MG tablet lorazepam 0.5 mg tablet  TAKE 1/2 TO 1 TABLET BY MOUTH TWICE A DAY AS NEEDED FOR ANXIETY    . sertraline (ZOLOFT) 100 MG tablet Take 200 mg by mouth daily.  2  . valACYclovir (VALTREX) 500 MG tablet valacyclovir 500 mg tablet  TAKE 1 TABLET BY MOUTH ONCE DAILY as needed     No current facility-administered  medications on file prior to visit.      Past Medical History:  Diagnosis Date  . Allergy   . Anxiety   . Depression   . Ectopic pregnancy July 2013   methotrexate x2  . HSV (herpes simplex virus) anogenital infection   . Urinary tract infection    Allergies  Allergen Reactions  . Sulfa Antibiotics Hives    Social History   Socioeconomic History  . Marital status: Married    Spouse name: Not on file  . Number of children: Not on file  . Years of education: Not on file  . Highest education level: Not on file  Occupational History  . Occupation: CMA    Employer: [employer]  Social Needs  . Financial resource strain: Not on file  . Food insecurity:    Worry: Not on file    Inability: Not on file  . Transportation needs:    Medical: Not on file    Non-medical: Not on file  Tobacco Use  . Smoking status: Never Smoker  . Smokeless tobacco: Never Used  Substance and Sexual Activity  . Alcohol use: Yes    Comment: socially   . Drug use: No  . Sexual activity: Yes  Lifestyle  . Physical activity:    Days per week: Not on file    Minutes per session: Not on file  . Stress: Not on file  Relationships  . Social connections:    Talks on phone: Not on file    Gets  together: Not on file    Attends religious service: Not on file    Active member of club or organization: Not on file    Attends meetings of clubs or organizations: Not on file    Relationship status: Not on file  Other Topics Concern  . Not on file  Social History Narrative  . Not on file    Vitals:   06/17/18 1013  BP: 124/82  Pulse: 61  Resp: 12  Temp: 98.5 F (36.9 C)  SpO2: 99%   Body mass index is 27.76 kg/m.   Physical Exam  Nursing note and vitals reviewed. Constitutional: She is oriented to person, place, and time. She appears well-developed and well-nourished. She does not appear ill. No distress.  HENT:  Head: Normocephalic and atraumatic.  Nose: Rhinorrhea and sinus  tenderness (Mild tenderness upon palpation of nose bridge and upon vibration with tuning fork) present. No nasal deformity, septal deviation or nasal septal hematoma. No epistaxis.  Mouth/Throat: Oropharynx is clear and moist and mucous membranes are normal.  Hypertrophic turbinates and hyperemic nasal mucosa, bilateral. Mild tenderness upon palpation of supraorbital bone,bilateral. Minimal edema nose bridge, no ecchymosis or crepitus.   Eyes: Pupils are equal, round, and reactive to light. Conjunctivae and EOM are normal.  Respiratory: Effort normal and breath sounds normal. No respiratory distress.  Lymphadenopathy:    She has no cervical adenopathy.  Neurological: She is alert and oriented to person, place, and time. She has normal strength. No cranial nerve deficit. Gait normal.  Skin: Skin is warm. No ecchymosis and no laceration noted. No erythema.  Psychiatric: Her speech is normal. Her mood appears anxious.  Well groomed, good eye contact.      ASSESSMENT AND PLAN:   Jacqueline Brown was seen today for nose pain/discomfort.  Diagnoses and all orders for this visit:  Blunt trauma of nose, initial encounter  Local ice, head elevation. Nose plain imaging ordered. Further recommendations will be given. Instructed about warning signs.  -     DG Nasal Bones; Future  Closed fracture of nasal bone, initial encounter  X ray report is back,non displaced nose fracture. Appt with ENT will be arranged.  -     Ambulatory referral to ENT      Betty G. Swazil[place], MD  [facility]. [place] office.

## 2018-07-11 ENCOUNTER — Encounter: Payer: Self-pay | Admitting: Family Medicine

## 2018-09-02 DIAGNOSIS — F419 Anxiety disorder, unspecified: Secondary | ICD-10-CM | POA: Diagnosis not present

## 2018-12-12 ENCOUNTER — Encounter: Payer: Self-pay | Admitting: Physician Assistant

## 2018-12-12 ENCOUNTER — Ambulatory Visit (INDEPENDENT_AMBULATORY_CARE_PROVIDER_SITE_OTHER): Payer: 59 | Admitting: Physician Assistant

## 2018-12-12 VITALS — BP 111/76 | HR 80

## 2018-12-12 DIAGNOSIS — F902 Attention-deficit hyperactivity disorder, combined type: Secondary | ICD-10-CM

## 2018-12-12 DIAGNOSIS — F422 Mixed obsessional thoughts and acts: Secondary | ICD-10-CM | POA: Diagnosis not present

## 2018-12-12 MED ORDER — AMPHETAMINE-DEXTROAMPHET ER 15 MG PO CP24: 15.0000 mg | ORAL_CAPSULE | ORAL | 0 refills | Status: DC

## 2018-12-12 MED FILL — ADDERALL XR 15 MG CAP SA: 15 | 30 days supply | Qty: 30 | Fill #0

## 2018-12-12 NOTE — Progress Notes (Signed)
[facility]  Patient ID: Jacqueline Brown,  MRN: [id number]  PCP: Deeann Saint, MD  Date of Evaluation: 12/12/2018 Time spent:15 minutes  Chief Complaint:  Chief Complaint    ADHD      HISTORY/CURRENT STATUS: HPI For routine med check.   B/C  insurance purposes wasn't able to come in last year.  Has had a really hard time focusing.  She has resorted to drinking red bull once a day to help her get her work done.  Unable to concentrate and get things done.  She will have multiple jobs going at once.  Her work is not suffering too much.  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   She weaned herself off the Zoloft last year.  She feels that her mood is good and she does not really need the Zoloft.  She does continue to have some OCD type symptoms, with certain things needing to be in place.  It is not nearly as bad as it used to be though.  She does not check locks or doors, wash hands repeatedly etc.  Patient denies loss of interest in usual activities and is able to enjoy things.  Denies decreased energy or motivation.  Appetite has not changed.  No extreme sadness, tearfulness, or feelings of hopelessness.  Denies suicidal or homicidal thoughts.  She has anxiety on occasion and does take Ativan rarely.  60 pills has lasted over a year  Individual Medical History/ Review of Systems: Changes? :No    Past medications for mental health diagnoses include: Ativan, BuSpar, Adderall, Zoloft  Allergies: Sulfa antibiotics  Current Medications:  Current Outpatient Medications:  .  levonorgestrel (MIRENA, 52 MG,) 20 MCG/24HR IUD, Mirena 20 mcg/24 hours (5 yrs) 52 mg intrauterine device  Take 1 device by intrauterine route., Disp: , Rfl:  .  LORazepam (ATIVAN) 0.5 MG tablet, lorazepam 0.5 mg tablet  TAKE 1/2 TO 1 TABLET BY MOUTH TWICE A DAY AS NEEDED FOR ANXIETY, Disp: , Rfl:  .  valACYclovir (VALTREX) 500 MG tablet, valacyclovir 500 mg tablet  TAKE 1 TABLET BY MOUTH ONCE DAILY  as needed, Disp: , Rfl:  .  amphetamine-dextroamphetamine (ADDERALL XR) 15 MG 24 hr capsule, Take 15 mg by mouth daily., Disp: , Rfl:  .  amphetamine-dextroamphetamine (ADDERALL XR) 15 MG 24 hr capsule, Take 1 capsule by mouth every morning., Disp: 30 capsule, Rfl: 0 .  sertraline (ZOLOFT) 100 MG tablet, Take 200 mg by mouth daily., Disp: , Rfl: 2 Medication Side Effects: none  Family Medical/ Social History: Changes? No  MENTAL HEALTH EXAM:  Blood pressure 111/76, pulse 80.There is no height or weight on file to calculate BMI.  General Appearance: Casual  Eye Contact:  Good  Speech:  Clear and Coherent  Volume:  Normal  Mood:  Euthymic  Affect:  Appropriate  Thought Process:  Goal Directed  Orientation:  Full (Time, Place, and Person)  Thought Content: Logical   Suicidal Thoughts:  No  Homicidal Thoughts:  No  Memory:  WNL  Judgement:  Good  Insight:  Good  Psychomotor Activity:  Normal  Concentration:  Concentration: Poor and Attention Span: Poor  Recall:  Good  Fund of Knowledge: Good  Language: Good  Assets:  Desire for Improvement  ADL's:  Intact  Cognition: WNL  Prognosis:  Good    DIAGNOSES:    ICD-10-CM   1. Attention deficit hyperactivity disorder (ADHD), combined type F90.2   2. Mixed obsessional thoughts and acts F42.2  Receiving Psychotherapy: No    RECOMMENDATIONS: Restart Adderall XR 15 mg every morning.  I think she will be able to tolerate that dose just fine but she knows to call if she has tachycardia or palpitations that do not resolve within an hour or so.  She is a Engineer, civil (consulting)nurse and can check her pulse and blood pressure and knows what to look for such as headache blurred vision etc. She can call in approximately 3 weeks and let us know how she is doing on that dose.  If it is going well then I will give her 3 prescriptions.  If we need to change the dose then we will have her do another 1 month trial and then go from there. Return in 4 months or sooner  as needed.   Melony Overlyeresa Jamaris Biernat, PA-C

## 2018-12-26 ENCOUNTER — Encounter: Payer: Self-pay | Admitting: Emergency Medicine

## 2018-12-26 DIAGNOSIS — F429 Obsessive-compulsive disorder, unspecified: Secondary | ICD-10-CM | POA: Insufficient documentation

## 2018-12-26 DIAGNOSIS — F988 Other specified behavioral and emotional disorders with onset usually occurring in childhood and adolescence: Secondary | ICD-10-CM | POA: Insufficient documentation

## 2018-12-26 DIAGNOSIS — F411 Generalized anxiety disorder: Secondary | ICD-10-CM | POA: Insufficient documentation

## 2018-12-26 DIAGNOSIS — F329 Major depressive disorder, single episode, unspecified: Secondary | ICD-10-CM | POA: Insufficient documentation

## 2019-01-16 ENCOUNTER — Other Ambulatory Visit: Payer: Self-pay | Admitting: Physician Assistant

## 2019-01-16 ENCOUNTER — Telehealth: Payer: Self-pay | Admitting: Physician Assistant

## 2019-01-16 MED ORDER — AMPHETAMINE-DEXTROAMPHET ER 15 MG PO CP24: 15.0000 mg | ORAL_CAPSULE | ORAL | 0 refills | Status: DC

## 2019-01-16 NOTE — Telephone Encounter (Signed)
Patient called and said that she needs a refill on her adderrall 15 mg to be escribed to [facility]

## 2019-01-17 MED FILL — ADDERALL XR 15 MG CAP SA: 15 | 30 days supply | Qty: 30 | Fill #0

## 2019-02-20 ENCOUNTER — Other Ambulatory Visit: Payer: Self-pay | Admitting: Physician Assistant

## 2019-02-20 ENCOUNTER — Telehealth: Payer: Self-pay | Admitting: Physician Assistant

## 2019-02-20 MED FILL — ADDERALL XR 15 MG CAP SA: 15 | 30 days supply | Qty: 30 | Fill #0

## 2019-02-20 MED FILL — LORazepam 0.5 MG TABS: 0.5 | 30 days supply | Qty: 30 | Fill #0 | Status: TO

## 2019-02-20 NOTE — Telephone Encounter (Signed)
Last refill lorazepam 12/11/2018 Last fill adderall 01/17/2019  You had submitted her April and May to [facility] pharmacy for Adderall already but they are closed now so can you resubmit to [facility]

## 2019-02-20 NOTE — Telephone Encounter (Signed)
Patient called and said that [facility] closed and she needs refills on her adderrall xr 15 mg  One every morning. [facility] sent one for April and may. So she would need two. She also needs ativan 0.5 mg 1/2 to 1 prn also sent to [facility]. So please send all of these scripts to [facility] patient pharmacy. Her next appt is 5/29

## 2019-02-20 NOTE — Telephone Encounter (Signed)
See previous rx's sent to you

## 2019-02-20 NOTE — Telephone Encounter (Signed)
This was sent in  

## 2019-04-11 ENCOUNTER — Other Ambulatory Visit: Payer: Self-pay

## 2019-04-11 ENCOUNTER — Encounter: Payer: Self-pay | Admitting: Physician Assistant

## 2019-04-11 ENCOUNTER — Ambulatory Visit (INDEPENDENT_AMBULATORY_CARE_PROVIDER_SITE_OTHER): Payer: 59 | Admitting: Physician Assistant

## 2019-04-11 DIAGNOSIS — F902 Attention-deficit hyperactivity disorder, combined type: Secondary | ICD-10-CM

## 2019-04-11 MED ORDER — AMPHETAMINE-DEXTROAMPHET ER 15 MG PO CP24: 15.0000 mg | ORAL_CAPSULE | ORAL | 0 refills | Status: DC

## 2019-04-11 MED FILL — ADDERALL XR 15 MG CAP SA: 15 | 30 days supply | Qty: 30 | Fill #0

## 2019-04-11 NOTE — Progress Notes (Signed)
[facility]  Patient ID: Jacqueline Brown,  MRN: [id number]  PCP: Deeann SaintBanks, Shannon R, MD  Date of Evaluation: 04/11/2019 Time spent:15 minutes  Chief Complaint:  Chief Complaint    Follow-up     Virtual Visit via Telephone Note  I connected with patient by a video enabled telemedicine application or telephone, with their informed consent, and verified patient privacy and that I am speaking with the correct person using two identifiers.  I am private, in my home and the patient is at work.   I discussed the limitations, risks, security and privacy concerns of performing an evaluation and management service by telephone and the availability of in person appointments. I also discussed with the patient that there may be a patient responsible charge related to this service. The patient expressed understanding and agreed to proceed.   I discussed the assessment and treatment plan with the patient. The patient was provided an opportunity to ask questions and all were answered. The patient agreed with the plan and demonstrated an understanding of the instructions.   The patient was advised to call back or seek an in-person evaluation if the symptoms worsen or if the condition fails to improve as anticipated.  I provided 15 minutes of non-face-to-face time during this encounter.  HISTORY/CURRENT STATUS: HPI For routine med check.  States she is doing really well.  The Adderall continues to work like it should.  States that attention is good without easy distractibility.  Able to focus on things and finish tasks to completion.  She usually does not take the Adderall on the weekends.  States she does not need it as much and does not want to become tolerant to it.  Has been off Zoloft for a while now.  She has not really missed it.  She feels that a lot of the obsessional thoughts she was having is secondary to the ADD/ADHD.  He had a hard time with distractions and therefore she  would get thinking about things until she got them done.  Does not feel like she needs the Zoloft now.  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  "I do not worry about it so much but she has made me anxious.  I have needed to take the Ativan a little bit more because of her anxiety.  I have only needed it 2 or 3 times a week over the past few weeks."  States she is sleeping well.  Denies dizziness, syncope, seizures, numbness, tingling, tremor, tics, unsteady gait, slurred speech, confusion. Denies muscle or joint pain, stiffness, or dystonia.  Individual Medical History/ Review of Systems: Changes? :No   Allergies: Sulfa antibiotics  Current Medications:  Current Outpatient Medications:  .  ADDERALL XR 15 MG 24 hr capsule, TAKE 1 CAPSULE BY MOUTH EVERY MORNING., Disp: 30 capsule, Rfl: 0 .  amphetamine-dextroamphetamine (ADDERALL XR) 15 MG 24 hr capsule, Take 15 mg by mouth daily., Disp: , Rfl:  .  [START ON 06/09/2019] amphetamine-dextroamphetamine (ADDERALL XR) 15 MG 24 hr capsule, Take 1 capsule by mouth every morning., Disp: 30 capsule, Rfl: 0 .  [START ON 05/11/2019] amphetamine-dextroamphetamine (ADDERALL XR) 15 MG 24 hr capsule, Take 1 capsule by mouth every morning., Disp: 30 capsule, Rfl: 0 .  amphetamine-dextroamphetamine (ADDERALL XR) 15 MG 24 hr capsule, Take 1 capsule by mouth every morning., Disp: 30 capsule, Rfl: 0 .  levonorgestrel (MIRENA, 52 MG,) 20 MCG/24HR IUD, Mirena 20 mcg/24 hours (5 yrs) 52 mg intrauterine device  Take 1 device by intrauterine route., Disp: , Rfl:  .  LORazepam (ATIVAN) 0.5 MG tablet, TAKE 1/2 TO 1 TABLET BY MOUTH TWICE A DAY AS NEEDED FOR ANXIETY, Disp: 30 tablet, Rfl: 1 .  valACYclovir (VALTREX) 500 MG tablet, valacyclovir 500 mg tablet  TAKE 1 TABLET BY MOUTH ONCE DAILY as needed, Disp: , Rfl:  .  sertraline (ZOLOFT) 100 MG tablet, Take 200 mg by mouth  daily., Disp: , Rfl: 2 Medication Side Effects: none  Family Medical/ Social History: Changes? No  MENTAL HEALTH EXAM:  There were no vitals taken for this visit.There is no height or weight on file to calculate BMI.  General Appearance: unable to assess  Eye Contact:  unable to assess  Speech:  Clear and Coherent  Volume:  Normal  Mood:  Euthymic  Affect:  unable to assess  Thought Process:  Goal Directed  Orientation:  Full (Time, Place, and Person)  Thought Content: Logical   Suicidal Thoughts:  No  Homicidal Thoughts:  No  Memory:  WNL  Judgement:  Good  Insight:  Good  Psychomotor Activity:  unable to assess  Concentration:  Concentration: Good and Attention Span: Good  Recall:  Good  Fund of Knowledge: Good  Language: Good  Assets:  Desire for Improvement  ADL's:  Intact  Cognition: WNL  Prognosis:  Good    DIAGNOSES:    ICD-10-CM   1. Attention deficit hyperactivity disorder (ADHD), combined type F90.2     Receiving Psychotherapy: No    RECOMMENDATIONS: I am glad to see that she is doing so well. Continue Adderall XR 15 mg p.o. every morning.  PDMP was reviewed and is okay. Return in 6 months.   Melony Overly, PA-C   This record has been created using [employer].  Chart creation errors have been sought, but may not always have been located and corrected. Such creation errors do not reflect on the standard of medical care.

## 2019-04-21 ENCOUNTER — Encounter: Payer: 59 | Admitting: Family Medicine

## 2019-04-30 ENCOUNTER — Ambulatory Visit (INDEPENDENT_AMBULATORY_CARE_PROVIDER_SITE_OTHER): Payer: 59 | Admitting: Family Medicine

## 2019-04-30 ENCOUNTER — Encounter: Payer: Self-pay | Admitting: Family Medicine

## 2019-04-30 ENCOUNTER — Other Ambulatory Visit: Payer: Self-pay

## 2019-04-30 VITALS — BP 108/80 | HR 96 | Temp 98.1°F | Wt 159.0 lb

## 2019-04-30 DIAGNOSIS — R5383 Other fatigue: Secondary | ICD-10-CM | POA: Diagnosis not present

## 2019-04-30 DIAGNOSIS — R011 Cardiac murmur, unspecified: Secondary | ICD-10-CM | POA: Diagnosis not present

## 2019-04-30 DIAGNOSIS — Z0000 Encounter for general adult medical examination without abnormal findings: Secondary | ICD-10-CM | POA: Diagnosis not present

## 2019-04-30 DIAGNOSIS — R6882 Decreased libido: Secondary | ICD-10-CM

## 2019-04-30 DIAGNOSIS — Z13220 Encounter for screening for lipoid disorders: Secondary | ICD-10-CM

## 2019-04-30 LAB — CBC WITH DIFFERENTIAL/PLATELET
Basophils Absolute: 0 10*3/uL (ref 0.0–0.1)
Basophils Relative: 0.6 % (ref 0.0–3.0)
Eosinophils Absolute: 0 10*3/uL (ref 0.0–0.7)
Eosinophils Relative: 0.6 % (ref 0.0–5.0)
HCT: 44.6 % (ref 36.0–46.0)
Hemoglobin: 15.4 g/dL — ABNORMAL HIGH (ref 12.0–15.0)
Lymphocytes Relative: 51.1 % — ABNORMAL HIGH (ref 12.0–46.0)
Lymphs Abs: 2.4 10*3/uL (ref 0.7–4.0)
MCHC: 34.4 g/dL (ref 30.0–36.0)
MCV: 89.2 fl (ref 78.0–100.0)
Monocytes Absolute: 0.3 10*3/uL (ref 0.1–1.0)
Monocytes Relative: 6.3 % (ref 3.0–12.0)
Neutro Abs: 2 10*3/uL (ref 1.4–7.7)
Neutrophils Relative %: 41.4 % — ABNORMAL LOW (ref 43.0–77.0)
Platelets: 295 10*3/uL (ref 150.0–400.0)
RBC: 5.01 Mil/uL (ref 3.87–5.11)
RDW: 13.2 % (ref 11.5–15.5)
WBC: 4.8 10*3/uL (ref 4.0–10.5)

## 2019-04-30 LAB — T4, FREE: Free T4: 0.87 ng/dL (ref 0.60–1.60)

## 2019-04-30 LAB — TSH: TSH: 1.37 u[IU]/mL (ref 0.35–4.50)

## 2019-04-30 LAB — BASIC METABOLIC PANEL
BUN: 8 mg/dL (ref 6–23)
CO2: 28 mEq/L (ref 19–32)
Calcium: 9 mg/dL (ref 8.4–10.5)
Chloride: 104 mEq/L (ref 96–112)
Creatinine, Ser: 0.7 mg/dL (ref 0.40–1.20)
GFR: 98.29 mL/min (ref >60.00)
Glucose, Bld: 77 mg/dL (ref 70–99)
Potassium: 3.9 mEq/L (ref 3.5–5.1)
Sodium: 139 mEq/L (ref 135–145)

## 2019-04-30 LAB — LIPID PANEL
Cholesterol: 172 mg/dL (ref 0–200)
HDL: 80.4 mg/dL (ref >39.00)
LDL Cholesterol: 82 mg/dL (ref 0–99)
NonHDL: 91.92
Total CHOL/HDL Ratio: 2
Triglycerides: 51 mg/dL (ref 0.0–149.0)
VLDL: 10.2 mg/dL (ref 0.0–40.0)

## 2019-04-30 LAB — VITAMIN B12: Vitamin B-12: 651 pg/mL (ref 211–911)

## 2019-04-30 LAB — VITAMIN D 25 HYDROXY (VIT D DEFICIENCY, FRACTURES): VITD: 32.16 ng/mL (ref 30.00–100.00)

## 2019-04-30 LAB — HEMOGLOBIN A1C: Hgb A1c MFr Bld: 5.2 % (ref 4.6–6.5)

## 2019-04-30 NOTE — Patient Instructions (Signed)
Preventive Care 18-39 Years, Female Preventive care refers to lifestyle choices and visits with your health care provider that can promote health and wellness. What does preventive care include?   A yearly physical exam. This is also called an annual well check.  Dental exams once or twice a year.  Routine eye exams. Ask your health care provider how often you should have your eyes checked.  Personal lifestyle choices, including: ? Daily care of your teeth and gums. ? Regular physical activity. ? Eating a healthy diet. ? Avoiding tobacco and drug use. ? Limiting alcohol use. ? Practicing safe sex. ? Taking vitamin and mineral supplements as recommended by your health care provider. What happens during an annual well check? The services and screenings done by your health care provider during your annual well check will depend on your age, overall health, lifestyle risk factors, and family history of disease. Counseling Your health care provider may ask you questions about your:  Alcohol use.  Tobacco use.  Drug use.  Emotional well-being.  Home and relationship well-being.  Sexual activity.  Eating habits.  Work and work environment.  Method of birth control.  Menstrual cycle.  Pregnancy history. Screening You may have the following tests or measurements:  Height, weight, and BMI.  Diabetes screening. This is done by checking your blood sugar (glucose) after you have not eaten for a while (fasting).  Blood pressure.  Lipid and cholesterol levels. These may be checked every 5 years starting at age 20.  Skin check.  Hepatitis C blood test.  Hepatitis B blood test.  Sexually transmitted disease (STD) testing.  BRCA-related cancer screening. This may be done if you have a family history of breast, ovarian, tubal, or peritoneal cancers.  Pelvic exam and Pap test. This may be done every 3 years starting at age 21. Starting at age 30, this may be done every 5  years if you have a Pap test in combination with an HPV test. Discuss your test results, treatment options, and if necessary, the need for more tests with your health care provider. Vaccines Your health care provider may recommend certain vaccines, such as:  Influenza vaccine. This is recommended every year.  Tetanus, diphtheria, and acellular pertussis (Tdap, Td) vaccine. You may need a Td booster every 10 years.  Varicella vaccine. You may need this if you have not been vaccinated.  HPV vaccine. If you are 26 or younger, you may need three doses over 6 months.  Measles, mumps, and rubella (MMR) vaccine. You may need at least one dose of MMR. You may also need a second dose.  Pneumococcal 13-valent conjugate (PCV13) vaccine. You may need this if you have certain conditions and were not previously vaccinated.  Pneumococcal polysaccharide (PPSV23) vaccine. You may need one or two doses if you smoke cigarettes or if you have certain conditions.  Meningococcal vaccine. One dose is recommended if you are age 19-21 years and a first-year college student living in a residence hall, or if you have one of several medical conditions. You may also need additional booster doses.  Hepatitis A vaccine. You may need this if you have certain conditions or if you travel or work in places where you may be exposed to hepatitis A.  Hepatitis B vaccine. You may need this if you have certain conditions or if you travel or work in places where you may be exposed to hepatitis B.  Haemophilus influenzae type b (Hib) vaccine. You may need this if you   have certain risk factors. Talk to your health care provider about which screenings and vaccines you need and how often you need them. This information is not intended to replace advice given to you by your health care provider. Make sure you discuss any questions you have with your health care provider. Document Released: 12/26/2001 Document Revised: 06/12/2017  Document Reviewed: 08/31/2015 Elsevier Interactive Patient Education  2019 [place] A heart murmur is an extra sound that is caused by chaotic blood flow through the valves of the heart. The murmur can be heard as a "hum" or "whoosh" sound when blood flows through the heart. There are two types of heart murmurs:  Innocent (benign) murmurs. Most people with this type of heart murmur do not have a heart problem. Many children have innocent heart murmurs. Your health care provider may suggest some basic tests to find out whether your murmur is an innocent murmur. If an innocent heart murmur is found, there is no need for further tests or treatment and no need to restrict activities or stop playing sports.  Abnormal murmurs. These types of murmurs can occur in children and adults. Abnormal murmurs may be a sign of a more serious heart condition, such as a heart defect present at birth (congenital defect) or heart valve disease. What are the causes?  The heart has four areas called chambers. Valves separate the upper and lower chambers from each other (tricuspid valve and mitral valve) and separate the lower chambers of the heart from pathways that lead away from the heart (aortic valve and pulmonary valve). Normally, the valves open to let blood flow through or out of your heart, and then they shut to keep the blood from flowing backward. This condition is caused by heart valves that are not working properly.  In children, abnormal heart murmurs are typically caused by congenital defects.  In adults, abnormal murmurs are usually caused by heart valve problems from disease, infection, or aging. This condition may also be caused by:  Pregnancy.  Fever.  Overactive thyroid gland.  Anemia.  Exercise.  Rapid growth spurts (in children). What are the signs or symptoms? Innocent murmurs do not cause symptoms, and many people with abnormal murmurs may not have symptoms. If  symptoms do develop, they may include:  Shortness of breath.  Blue coloring of the skin, especially on the fingertips.  Chest pain.  Palpitations, or feeling a fluttering or skipped heartbeat.  Fainting.  Persistent cough.  Getting tired much faster than expected.  Swelling in the abdomen, feet, or ankles. How is this diagnosed? This condition may be diagnosed during a routine physical or other exam. If your health care provider hears a murmur with a stethoscope, he or she will listen for:  Where the murmur is located in your heart.  How long the murmur lasts (duration).  When the murmur is heard during the heartbeat.  How loud the murmur is. This may help the health care provider figure out what is causing the murmur. You may be referred to a heart specialist (cardiologist). You may also have other tests, including:  Electrocardiogram (ECG or EKG). This test measures the electrical activity of your heart.  Echocardiogram. This test uses high frequency sound waves to make pictures of your heart.  MRI or chest X-ray.  Cardiac catheterization. This test looks at blood flow through the arteries around the heart. For children and adults who have an abnormal heart murmur and want to stay active, it is  important to:  Complete testing.  Review test results.  Receive recommendations from your health care provider. If heart disease is present, it may not be safe to play or be active. How is this treated? Heart murmurs themselves do not need treatment. In some cases, a heart murmur may go away on its own. If an underlying problem or disease is causing the murmur, you may need treatment. If treatment is needed, it will depend on the type and severity of the disease or heart problem causing the murmur. Treatment may include:  Medicine.  Surgery.  Dietary and lifestyle changes. Follow these instructions at home:  Talk with your health care provider before participating in  sports or other activities that require a lot of effort and energy (are strenuous).  Learn as much as possible about your condition and any related diseases. Ask your health care provider if you may be at risk for any medical emergencies.  Talk with your health care provider about what symptoms you should look out for.  It is up to you to get your test results. Ask your health care provider, or the department that is doing the test, when your results will be ready.  Keep all follow-up visits as told by your health care provider. This is important. Contact a health care provider if:  You are frequently short of breath.  You feel more tired than usual.  You are having a hard time keeping up with normal activities or fitness routines.  You have swelling in your ankles or feet.  You notice that your heart often beats irregularly.  You develop any new symptoms. Get help right away if:  You have chest pain.  You are having trouble breathing.  You feel light-headed or you pass out.  Your symptoms suddenly get worse. These symptoms may represent a serious problem that is an emergency. Do not wait to see if the symptoms will go away. Get medical help right away. Call your local emergency services (911 in the [place] Do not drive yourself to the hospital. Summary  Normally, the heart valves open to let blood flow through or out of your heart, and then they shut to keep the blood from flowing backward.  A heart murmur is caused by heart valves that are not working properly.  You may need treatment if an underlying problem or disease is causing the heart murmur. Treatment may include medicine, surgery, or dietary and lifestyle changes.  Talk with your health care provider before participating in sports or other activities that require a lot of effort and energy (are strenuous).  Talk with your health care provider about what symptoms you should watch out for. This information is not  intended to replace advice given to you by your health care provider. Make sure you discuss any questions you have with your health care provider. Document Released: 12/07/2004 Document Revised: 04/23/2018 Document Reviewed: 04/23/2018 Elsevier Interactive Patient Education  2019 [employer].  Fatigue If you have fatigue, you feel tired all the time and have a lack of energy or a lack of motivation. Fatigue may make it difficult to start or complete tasks because of exhaustion. In general, occasional or mild fatigue is often a normal response to activity or life. However, long-lasting (chronic) or extreme fatigue may be a symptom of a medical condition. Follow these instructions at home: General instructions  Watch your fatigue for any changes.  Go to bed and get up at the same time every day.  Avoid  fatigue by pacing yourself during the day and getting enough sleep at night.  Maintain a healthy weight. Medicines  Take over-the-counter and prescription medicines only as told by your health care provider.  Take a multivitamin, if told by your health care provider.  Do not use herbal or dietary supplements unless they are approved by your health care provider. Activity   Exercise regularly, as told by your health care provider.  Use or practice techniques to help you relax, such as yoga, tai chi, meditation, or massage therapy. Eating and drinking   Avoid heavy meals in the evening.  Eat a well-balanced diet, which includes lean proteins, whole grains, plenty of fruits and vegetables, and low-fat dairy products.  Avoid consuming too much caffeine.  Avoid the use of alcohol.  Drink enough fluid to keep your urine pale yellow. Lifestyle  Change situations that cause you stress. Try to keep your work and personal schedule in balance.  Do not use any products that contain nicotine or tobacco, such as cigarettes and e-cigarettes. If you need help quitting, ask your health  care provider.  Do not use drugs. Contact a health care provider if:  Your fatigue does not get better.  You have a fever.  You suddenly lose or gain weight.  You have headaches.  You have trouble falling asleep or sleeping through the night.  You feel angry, guilty, anxious, or sad.  You are unable to have a bowel movement (constipation).  Your skin is dry.  You have swelling in your legs or another part of your body. Get help right away if:  You feel confused.  Your vision is blurry.  You feel faint or you pass out.  You have a severe headache.  You have severe pain in your abdomen, your back, or the area between your waist and hips (pelvis).  You have chest pain, shortness of breath, or an irregular or fast heartbeat.  You are unable to urinate, or you urinate less than normal.  You have abnormal bleeding, such as bleeding from the rectum, vagina, nose, lungs, or nipples.  You vomit blood.  You have thoughts about hurting yourself or others. If you ever feel like you may hurt yourself or others, or have thoughts about taking your own life, get help right away. You can go to your nearest emergency department or call:  Your local emergency services (911 in the [place]  A suicide crisis helpline, such as the [facility] at 662-493-6131. This is open 24 hours a day. Summary  If you have fatigue, you feel tired all the time and have a lack of energy or a lack of motivation.  Fatigue may make it difficult to start or complete tasks because of exhaustion.  Long-lasting (chronic) or extreme fatigue may be a symptom of a medical condition.  Exercise regularly, as told by your health care provider.  Change situations that cause you stress. Try to keep your work and personal schedule in balance. This information is not intended to replace advice given to you by your health care provider. Make sure you discuss any questions you have with  your health care provider. Document Released: 08/27/2007 Document Revised: 07/25/2017 Document Reviewed: 07/25/2017 Elsevier Interactive Patient Education  [employer].

## 2019-04-30 NOTE — Progress Notes (Signed)
Subjective:     Jacqueline Brown is a 28 y.o. female and is here for a comprehensive physical exam. The patient reports problems - fatigue and decreased libido.  Pt notes continued fatigue.  Initially attributed this to having a baby, but it has continued.  Pt denies feeling depressed.  Denies issues with sleep despite adderall use.  Pt also notes decreased libido.  Pt weaned off of Zoloft last yr.  Started in the second trimester of her pregnancy and has continued post-partum.  Pt has no desire at all.  States during intercourse adequately lubricated, however has thoughts of how long is this going to last.  Pt feels obligated to have sex as she does not want to cause negative effects to her marriage.  Pt inquires if she needs her testosterone checked.  Social History   Socioeconomic History  . Marital status: Married    Spouse name: Not on file  . Number of children: Not on file  . Years of education: Not on file  . Highest education level: Not on file  Occupational History  . Occupation: CMA    Employer: [employer]  Social Needs  . Financial resource strain: Not on file  . Food insecurity    Worry: Not on file    Inability: Not on file  . Transportation needs    Medical: Not on file    Non-medical: Not on file  Tobacco Use  . Smoking status: Never Smoker  . Smokeless tobacco: Never Used  Substance and Sexual Activity  . Alcohol use: Yes    Comment: socially   . Drug use: No  . Sexual activity: Yes  Lifestyle  . Physical activity    Days per week: Not on file    Minutes per session: Not on file  . Stress: Not on file  Relationships  . Social Musicianconnections    Talks on phone: Not on file    Gets together: Not on file    Attends religious service: Not on file    Active member of club or organization: Not on file    Attends meetings of clubs or organizations: Not on file    Relationship status: Not on file  . Intimate partner violence    Fear of current or ex partner: Not  on file    Emotionally abused: Not on file    Physically abused: Not on file    Forced sexual activity: Not on file  Other Topics Concern  . Not on file  Social History Narrative  . Not on file   Health Maintenance  Topic Date Due  . INFLUENZA VACCINE  06/14/2019  . PAP SMEAR-Modifier  05/11/2020  . PAP-Cervical Cytology Screening  05/10/2021  . TETANUS/TDAP  01/17/2027  . HIV Screening  Completed    The following portions of the patient's history were reviewed and updated as appropriate: allergies, current medications, past family history, past medical history, past social history, past surgical history and problem list.  Review of Systems Pertinent items noted in HPI and remainder of comprehensive ROS otherwise negative.   Objective:    BP 108/80 (BP Location: Right Arm, Patient Position: Sitting, Cuff Size: Normal)   Pulse 96   Temp 98.1 F (36.7 C) (Oral)   Wt 159 lb (72.1 kg)   LMP 04/24/2019 (Exact Date) Comment: pt report spotting for 1 day  SpO2 97%   BMI 25.66 kg/m  General appearance: alert, cooperative and no distress Head: Normocephalic, without obvious abnormality, atraumatic Eyes:  conjunctivae/corneas clear. PERRL, EOM's intact. Fundi benign. Ears: normal TM's and external ear canals both ears Nose: Nares normal. Septum midline. Mucosa normal. No drainage or sinus tenderness. Throat: lips, mucosa, and tongue normal; teeth and gums normal Neck: no adenopathy, no carotid bruit, no JVD, supple, symmetrical, trachea midline and thyroid not enlarged, symmetric, no tenderness/mass/nodules Lungs: clear to auscultation bilaterally Heart: regular rate and rhythm and 2/6 murmur best heard in L upper chest midcalvicular line Abdomen: soft, non-tender; bowel sounds normal; no masses,  no organomegaly Extremities: extremities normal, atraumatic, no cyanosis or edema Skin: Skin color, texture, turgor normal. No rashes or lesions Lymph nodes: Cervical, supraclavicular,  and axillary nodes normal. Neurologic: Alert and oriented X 3, normal strength and tone. Normal symmetric reflexes. Normal coordination and gait    Assessment:    Healthy female exam with complaints of fatigue and decreased libido.      Plan:     Anticipatory guidance given including wearing seatbelts, smoke detectors in the home, increasing physical activity, increasing p.o. intake of water and vegetables. -will obtain labs -pap up to date, done by OB/Gyn in 2019 -given handouts See After Visit Summary for Counseling Recommendations    Murmur, cardiac -discussed various causes  - Plan: ECHO  Fatigue, unspecified type  - Plan: CBC with Differential/Platelet, Basic metabolic panel, TSH, T4, Free, Hemoglobin A1c, Vitamin D, 25-hydroxy, Vitamin B12  Decreased libido  -given handout -advised testosterone not indicated. -discussed various causes including medications, depression, etc  Screening for cholesterol level  - Plan: Lipid panel  F/u prn  Grier Mitts, MD

## 2019-05-02 ENCOUNTER — Encounter (HOSPITAL_COMMUNITY): Payer: Self-pay | Admitting: Family Medicine

## 2019-05-07 DIAGNOSIS — Z20828 Contact with and (suspected) exposure to other viral communicable diseases: Secondary | ICD-10-CM | POA: Diagnosis not present

## 2019-05-09 ENCOUNTER — Telehealth (HOSPITAL_COMMUNITY): Payer: Self-pay | Admitting: *Deleted

## 2019-05-09 NOTE — Telephone Encounter (Signed)
Left Message per DPR  Containing the following.   COVID-19 Pre-Screening Questions:  . Do you currently have a fever? NO (yes = cancel and refer to pcp for e-visit) . Have you recently travelled on a cruise, internationally, or to [place], [place], [place], [place], [place], or [place], [place] [employer]) ? msg (yes = cancel, stay home, monitor symptoms, and contact pcp or initiate e-visit if symptoms develop) . Have you been in contact with someone that is currently pending confirmation of Covid19 testing or has been confirmed to have the Covid19 virus?  msg (yes = cancel, stay home, away from tested individual, monitor symptoms, and contact pcp or initiate e-visit if symptoms develop) . Are you currently experiencing fatigue or cough? msg (yes = pt should be prepared to have a mask placed at the time of their visit).   . Reiterated no additional visitors. Eartha Inch no earlier than 15 minutes before appointment time. . Please bring own mask.  Jacqueline Brown

## 2019-05-12 ENCOUNTER — Ambulatory Visit (HOSPITAL_COMMUNITY): Payer: 59 | Attending: Cardiology

## 2019-05-12 ENCOUNTER — Other Ambulatory Visit: Payer: Self-pay

## 2019-05-12 DIAGNOSIS — R5383 Other fatigue: Secondary | ICD-10-CM | POA: Insufficient documentation

## 2019-05-12 DIAGNOSIS — R011 Cardiac murmur, unspecified: Secondary | ICD-10-CM | POA: Diagnosis not present

## 2019-05-26 DIAGNOSIS — Z01419 Encounter for gynecological examination (general) (routine) without abnormal findings: Secondary | ICD-10-CM | POA: Diagnosis not present

## 2019-05-26 DIAGNOSIS — Z6826 Body mass index (BMI) 26.0-26.9, adult: Secondary | ICD-10-CM | POA: Diagnosis not present

## 2019-05-26 DIAGNOSIS — Z30432 Encounter for removal of intrauterine contraceptive device: Secondary | ICD-10-CM | POA: Diagnosis not present

## 2019-05-26 DIAGNOSIS — Z113 Encounter for screening for infections with a predominantly sexual mode of transmission: Secondary | ICD-10-CM | POA: Diagnosis not present

## 2019-05-26 DIAGNOSIS — Z124 Encounter for screening for malignant neoplasm of cervix: Secondary | ICD-10-CM | POA: Diagnosis not present

## 2019-05-27 MED FILL — LORazepam 0.5 MG TABS: 0.5 | 30 days supply | Qty: 30 | Fill #0

## 2019-05-27 MED FILL — ADDERALL XR 15 MG CAP SA: 15 | 30 days supply | Qty: 30 | Fill #0

## 2019-07-17 ENCOUNTER — Telehealth: Payer: Self-pay | Admitting: Physician Assistant

## 2019-07-17 ENCOUNTER — Other Ambulatory Visit: Payer: Self-pay

## 2019-07-17 MED ORDER — AMPHETAMINE-DEXTROAMPHET ER 15 MG PO CP24: 15.0000 mg | ORAL_CAPSULE | ORAL | 0 refills | Status: DC

## 2019-07-17 NOTE — Telephone Encounter (Signed)
Patient would like new script for Adderall XR to be sent to [facility] in [place] [phone], patient has one but she needed to [place] pharmacies

## 2019-07-17 NOTE — Telephone Encounter (Signed)
Last refill 05/27/2019 Pended with a new pharmacy added to list

## 2019-10-02 ENCOUNTER — Ambulatory Visit: Payer: 59 | Admitting: Physician Assistant

## 2019-11-11 ENCOUNTER — Other Ambulatory Visit (HOSPITAL_COMMUNITY): Payer: Self-pay | Admitting: Obstetrics & Gynecology

## 2019-11-11 DIAGNOSIS — Z3A18 18 weeks gestation of pregnancy: Secondary | ICD-10-CM

## 2019-11-11 DIAGNOSIS — Z3689 Encounter for other specified antenatal screening: Secondary | ICD-10-CM

## 2019-11-12 NOTE — Progress Notes (Signed)
Cardiology Office Note:   Date:  11/13/2019  NAME:  Jacqueline GivensMitzi Damron Brown    MRN: [id number] DOB:  05/01/1991   PCP:  Deeann SaintBanks, Shannon R, MD  Cardiologist:  No primary care provider on file.   Referring MD: Essie HartPinn, Walda, MD   Chief Complaint  Patient presents with  . Palpitations   History of Present Illness:   Jacqueline Brown is a 28 y.o. female with a hx of anxiety/depression who is being seen today for the evaluation of palpitations at the request of Deeann SaintBanks, Shannon R, MD. Echo obtained by PCP in June of this year unremarkable.  She reports 1 to 2 months of worsening episodes of daily palpitations and shortness of breath.  She reports she gets episodic sensation of skipped heartbeats.  They can occur at any time without any associated trigger.  She reports no excess caffeine consumption.  She reports no alleviating factors.  She reports the last seconds and then resolved.  She also reports daily episodes of shortness of breath with walking short distances.  This is all been worsened by being pregnant.  Her palpitations are also worsened by being pregnant.  She does have a history of palpitations in the past but it seems that pregnancy is worse in these.  She reports that she did have some issues with spotting and was instructed to lay off activity.  So she does wonder if deconditioning is an issue here.  She does report a history of heart disease in her father but the details are unknown.  She does not smoke, consume alcohol or use illicit drugs.  She is relatively healthy prior to this.  No recent TSH.  The most recent thyroid studies from June were 1.37.  A1c 5.2.  Cholesterol unremarkable.  She denies any lower extremity edema, chest pain or symptoms concerning for PND orthopnea.  Her blood pressure is well controlled today.  Past Medical History: Past Medical History:  Diagnosis Date  . Allergy   . Anxiety   . Depression   . Ectopic pregnancy July 2013   methotrexate x2  . HSV (herpes  simplex virus) anogenital infection   . Urinary tract infection     Past Surgical History: Past Surgical History:  Procedure Laterality Date  . EXTERNAL EAR SURGERY    . NO PAST SURGERIES      Current Medications: Current Meds  Medication Sig  . folic acid (FOLVITE) 1 MG tablet Take 350 mg by mouth daily.  . multivitamin (VIT W/EXTRA C) CHEW chewable tablet Chew 1 tablet by mouth daily.     Allergies:    Sulfa antibiotics   Social History: Social History   Socioeconomic History  . Marital status: Married    Spouse name: Not on file  . Number of children: Not on file  . Years of education: Not on file  . Highest education level: Not on file  Occupational History  . Occupation: CMA    Employer: [employer]  Tobacco Use  . Smoking status: Never Smoker  . Smokeless tobacco: Never Used  Substance and Sexual Activity  . Alcohol use: Not Currently    Comment: socially   . Drug use: No  . Sexual activity: Yes  Other Topics Concern  . 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 Difficulty of Paying Living Expenses: Not on file  Food Insecurity:   . Worried  About Running Out of Food in the Last Year: Not on file  . Ran Out of Food in the Last Year: Not on file  Transportation Needs:   . Lack of Transportation (Medical): Not on file  . Lack of Transportation (Non-Medical): Not on file  Physical Activity:   . Days of Exercise per Week: Not on file  . Minutes of Exercise per Session: Not on file  Stress:   . Feeling of Stress : Not on file  Social Connections:   . Frequency of Communication with Friends and Family: Not on file  . Frequency of Social Gatherings with Friends and Family: Not on file  . Attends Religious Services: Not on file  . Active Member of Clubs or Organizations: Not on file  . Attends Banker Meetings: Not on file  . Marital Status: Not on file      Family History: The patient's family history includes Aneurysm in her mother; Anxiety disorder in her mother; Breast cancer in her paternal aunt; Heart disease in her father and maternal aunt; Osteoporosis in her maternal aunt, maternal grandmother, and mother; Ovarian cancer in her maternal aunt. There is no history of Other.  ROS:   All other ROS reviewed and negative. Pertinent positives noted in the HPI.     EKGs/Labs/Other Studies Reviewed:   The following studies were personally reviewed by me today:  EKG:  EKG is ordered today.  The ekg ordered today demonstrates normal sinus rhythm, heart rate 82, no acute ST-T changes, no evidence of prior infarction, and was personally reviewed by me.   TTE 05/12/2019  1. The left ventricle has normal systolic function, with an ejection fraction of 55-60%. The cavity size was normal. Left ventricular diastolic parameters were normal.  2. The right ventricle has normal systolic function. The cavity was normal.  3. The mitral valve is grossly normal.  4. The tricuspid valve is grossly normal.  5. The aortic valve was not well visualized. No stenosis of the aortic valve.  6. Technically difficult; normal LV function; aortic valve not well visualized but no obvious valvular heart disease.  Recent Labs: 04/30/2019: BUN 8; Creatinine, Ser 0.70; Hemoglobin 15.4; Platelets 295.0; Potassium 3.9; Sodium 139; TSH 1.37   Recent Lipid Panel    Component Value Date/Time   CHOL 172 04/30/2019 1206   TRIG 51.0 04/30/2019 1206   HDL 80.40 04/30/2019 1206   CHOLHDL 2 04/30/2019 1206   VLDL 10.2 04/30/2019 1206   LDLCALC 82 04/30/2019 1206    Physical Exam:   VS:  BP 116/79   Pulse 82   Temp (!) 97.3 F (36.3 C)   Ht  (1.676 m)   Wt 173 lb (78.5 kg)   BMI 27.92 kg/m    Wt Readings from Last 3 Encounters:  11/13/19 173 lb (78.5 kg)  04/30/19 159 lb (72.1 kg)  04/18/17 198 lb (89.8 kg)    General: Well nourished, well developed, in no acute  distress Heart: Atraumatic, normal size  Eyes: PEERLA, EOMI  Neck: Supple, no JVD Endocrine: No thryomegaly Cardiac: Normal S1, S2; RRR; no murmurs, rubs, or gallops Lungs: Clear to auscultation bilaterally, no wheezing, rhonchi or rales  Abd: Soft, nontender, no hepatomegaly  Ext: No edema, pulses 2+ Musculoskeletal: No deformities, BUE and BLE strength normal and equal Skin: Warm and dry, no rashes   Neuro: Alert and oriented to person, place, time, and situation, CNII-XII grossly intact, no focal deficits  Psych: Normal mood and affect  ASSESSMENT:   Jacqueline Brown is a 28 y.o. female who presents for the following: 1. Palpitations   2. SOB (shortness of breath)     PLAN:   1. Palpitations 2. SOB (shortness of breath) -I suspect her symptoms are possibly symptomatic PVCs or PACs.  I highly doubt they will require treatment.  We will recheck a TSH today.  We will also pursue a 3-day ZIO patch.  This will ensure no significant arrhythmia.  Her echocardiogram was structurally normal in June of this year and I see no need to repeat this. -Regarding her shortness of breath, I suspect this is deconditioning.  We will check a BNP today.  Most recent echocardiogram normal.  She has no symptoms of heart failure or evidence of volume overload on exam today.  Overall, I suspect this is more deconditioning.  We will follow-up by phone in 3 months.  Disposition: Return in about 3 months (around 02/11/2020).  Medication Adjustments/Labs and Tests Ordered: Current medicines are reviewed at length with the patient today.  Concerns regarding medicines are outlined above.  Orders Placed This Encounter  Procedures  . TSH  . B Nat Peptide  . LONG TERM MONITOR (3-14 DAYS)  . EKG 12-Lead   No orders of the defined types were placed in this encounter.   Patient Instructions  Medication Instructions:  No changes *If you need a refill on your cardiac medications before your next  appointment, please call your pharmacy*  Lab Work: Your physician recommends that you return for lab work today [facility])  If you have labs (blood work) drawn today and your tests are completely normal, you will receive your results only by: Marland Kitchen MyChart Message (if you have MyChart) OR . A paper copy in the mail If you have any lab test that is abnormal or we need to change your treatment, we will call you to review the results.  Testing/Procedures: Jacqueline Brown- Long Term Monitor Instructions   Your physician has requested you wear your ZIO patch monitor 3 days.   This is a single patch monitor.  [employer] supplies one patch monitor per enrollment.  Additional stickers are not available.   Please do not apply patch if you will be having a Nuclear Stress Test, Echocardiogram, Cardiac CT, MRI, or Chest Xray during the time frame you would be wearing the monitor. The patch cannot be worn during these tests.  You cannot remove and re-apply the ZIO XT patch monitor.   Your ZIO patch monitor will be sent USPS Priority mail from [facility] directly to your home address. The monitor may also be mailed to a PO BOX if home delivery is not available.   It may take 3-5 days to receive your monitor after you have been enrolled.   Once you have received you monitor, please review enclosed instructions.  Your monitor has already been registered assigning a specific monitor serial # to you.   Applying the monitor   Shave hair from upper left chest.   Hold abrader disc by orange tab.  Rub abrader in 40 strokes over left upper chest as indicated in your monitor instructions.   Clean area with 4 enclosed alcohol pads .  Use all pads to assure are is cleaned thoroughly.  Let dry.   Apply patch as indicated in monitor instructions.  Patch will be place under collarbone on left side of chest with arrow pointing upward.   Rub patch adhesive wings for 2 minutes.Remove white label marked "  1".  Remove white  label marked "2".  Rub patch adhesive wings for 2 additional minutes.   While looking in a mirror, press and release button in center of patch.  A small green light will flash 3-4 times .  This will be your only indicator the monitor has been turned on.     Do not shower for the first 24 hours.  You may shower after the first 24 hours.   Press button if you feel a symptom. You will hear a small click.  Record Date, Time and Symptom in the Patient Log Book.   When you are ready to remove patch, follow instructions on last 2 pages of Patient Log Book.  Stick patch monitor onto last page of Patient Log Book.   Place Patient Log Book in [place] box.  Use locking tab on box and tape box closed securely.  The [employer] and [employer] has [employer] on it.  Please place in mailbox as soon as possible.  Your physician should have your test results approximately 7 days after the monitor has been mailed back to [facility].   Call [place] at [phone] if you have questions regarding your ZIO XT patch monitor.  Call them immediately if you see an orange light blinking on your monitor.   If your monitor falls off in less than 4 days contact our Monitor department at [phone].  If your monitor becomes loose or falls off after 4 days call [employer] at [phone] for suggestions on securing your monitor.    Follow-Up: At [facility], you and your health needs are our priority.  As part of our continuing mission to provide you with exceptional heart care, we have created designated Provider Care Teams.  These Care Teams include your primary Cardiologist (physician) and Advanced Practice Providers (APPs -  Physician Assistants and Nurse Practitioners) who all work together to provide you with the care you need, when you need it.  Your next appointment:   3 month(s)  The format for your next appointment:   Virtual  Provider:   Eleonore Chiquito, MD      Signed, Addison Naegeli.  Audie Box, [place]  [address], [place], [place] [address] [phone]  11/13/2019 2:06 PM

## 2019-11-13 ENCOUNTER — Encounter: Payer: Self-pay | Admitting: Cardiovascular Disease

## 2019-11-13 ENCOUNTER — Ambulatory Visit (INDEPENDENT_AMBULATORY_CARE_PROVIDER_SITE_OTHER): Payer: 59 | Admitting: Cardiovascular Disease

## 2019-11-13 ENCOUNTER — Telehealth: Payer: Self-pay | Admitting: Radiology

## 2019-11-13 ENCOUNTER — Other Ambulatory Visit: Payer: Self-pay

## 2019-11-13 VITALS — BP 116/79 | HR 82 | Temp 97.3°F | Ht 66.0 in | Wt 173.0 lb

## 2019-11-13 DIAGNOSIS — R0602 Shortness of breath: Secondary | ICD-10-CM | POA: Diagnosis not present

## 2019-11-13 DIAGNOSIS — R002 Palpitations: Secondary | ICD-10-CM | POA: Diagnosis not present

## 2019-11-13 NOTE — Telephone Encounter (Signed)
Enrolled patient for a 3 day Zio monitor to be mailed to patients home.  

## 2019-11-13 NOTE — Patient Instructions (Signed)
Medication Instructions:  No changes *If you need a refill on your cardiac medications before your next appointment, please call your pharmacy*  Lab Work: Your physician recommends that you return for lab work today [facility])  If you have labs (blood work) drawn today and your tests are completely normal, you will receive your results only by: Marland Kitchen MyChart Message (if you have MyChart) OR . A paper copy in the mail If you have any lab test that is abnormal or we need to change your treatment, we will call you to review the results.  Testing/Procedures: Bryn Gull[place]- Long Term Monitor Instructions   Your physician has requested you wear your ZIO patch monitor 3 days.   This is a single patch monitor.  [employer] supplies one patch monitor per enrollment.  Additional stickers are not available.   Please do not apply patch if you will be having a Nuclear Stress Test, Echocardiogram, Cardiac CT, MRI, or Chest Xray during the time frame you would be wearing the monitor. The patch cannot be worn during these tests.  You cannot remove and re-apply the ZIO XT patch monitor.   Your ZIO patch monitor will be sent USPS Priority mail from [facility] directly to your home address. The monitor may also be mailed to a PO BOX if home delivery is not available.   It may take 3-5 days to receive your monitor after you have been enrolled.   Once you have received you monitor, please review enclosed instructions.  Your monitor has already been registered assigning a specific monitor serial # to you.   Applying the monitor   Shave hair from upper left chest.   Hold abrader disc by orange tab.  Rub abrader in 40 strokes over left upper chest as indicated in your monitor instructions.   Clean area with 4 enclosed alcohol pads .  Use all pads to assure are is cleaned thoroughly.  Let dry.   Apply patch as indicated in monitor instructions.  Patch will be place under collarbone on left side of chest with arrow  pointing upward.   Rub patch adhesive wings for 2 minutes.Remove white label marked "1".  Remove white label marked "2".  Rub patch adhesive wings for 2 additional minutes.   While looking in a mirror, press and release button in center of patch.  A small green light will flash 3-4 times .  This will be your only indicator the monitor has been turned on.     Do not shower for the first 24 hours.  You may shower after the first 24 hours.   Press button if you feel a symptom. You will hear a small click.  Record Date, Time and Symptom in the Patient Log Book.   When you are ready to remove patch, follow instructions on last 2 pages of Patient Log Book.  Stick patch monitor onto last page of Patient Log Book.   Place Patient Log Book in [place] box.  Use locking tab on box and tape box closed securely.  The [employer] has [employer] on it.  Please place in mailbox as soon as possible.  Your physician should have your test results approximately 7 days after the monitor has been mailed back to [facility].   Call [place] at [phone] if you have questions regarding your ZIO XT patch monitor.  Call them immediately if you see an orange light blinking on your monitor.   If your monitor falls  off in less than 4 days contact our Monitor department at [phone].  If your monitor becomes loose or falls off after 4 days call [employer] at [phone] for suggestions on securing your monitor.    Follow-Up: At [facility], you and your health needs are our priority.  As part of our continuing mission to provide you with exceptional heart care, we have created designated Provider Care Teams.  These Care Teams include your primary Cardiologist (physician) and Advanced Practice Providers (APPs -  Physician Assistants and Nurse Practitioners) who all work together to provide you with the care you need, when you need it.  Your next appointment:   3 month(s)  The  format for your next appointment:   Virtual  Provider:   Lennie Odor, MD

## 2019-11-14 LAB — TSH: TSH: 0.579 u[IU]/mL (ref 0.450–4.500)

## 2019-11-14 LAB — BRAIN NATRIURETIC PEPTIDE: BNP: 6.6 pg/mL (ref 0.0–100.0)

## 2019-11-20 ENCOUNTER — Ambulatory Visit (INDEPENDENT_AMBULATORY_CARE_PROVIDER_SITE_OTHER): Payer: 59

## 2019-11-20 DIAGNOSIS — R002 Palpitations: Secondary | ICD-10-CM | POA: Diagnosis not present

## 2019-11-27 ENCOUNTER — Ambulatory Visit (HOSPITAL_COMMUNITY): Payer: 59

## 2019-12-09 ENCOUNTER — Ambulatory Visit (HOSPITAL_COMMUNITY): Payer: 59

## 2019-12-09 ENCOUNTER — Encounter (HOSPITAL_COMMUNITY): Payer: Self-pay

## 2020-02-06 ENCOUNTER — Telehealth: Payer: 59 | Admitting: Cardiovascular Disease

## 2020-07-13 IMAGING — DX DG NASAL BONES 3+V
3 series · 3 of 3 positions shown · non-contrast
Comparison: None.

CLINICAL DATA: Swelling after trauma.

EXAM:
NASAL BONES - 3+ VIEW

[sinuses ([person_name]) pa]
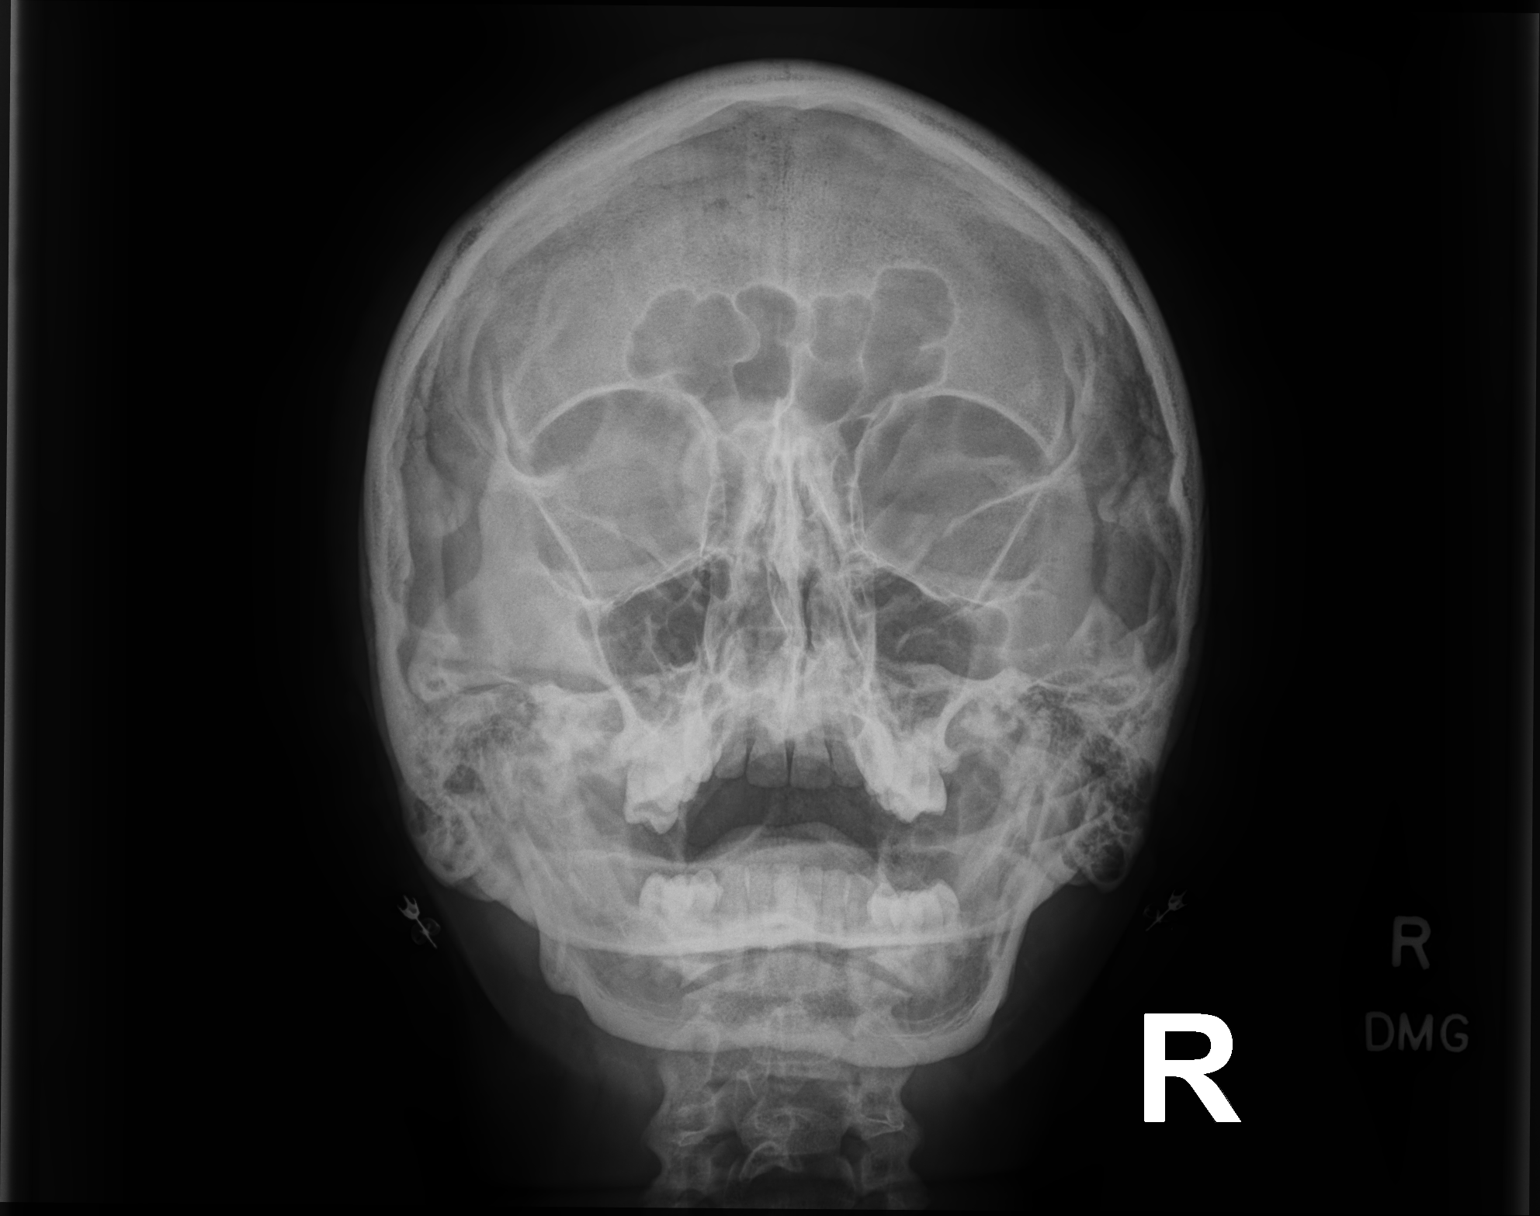

[sinuses lat (1 of 2)]
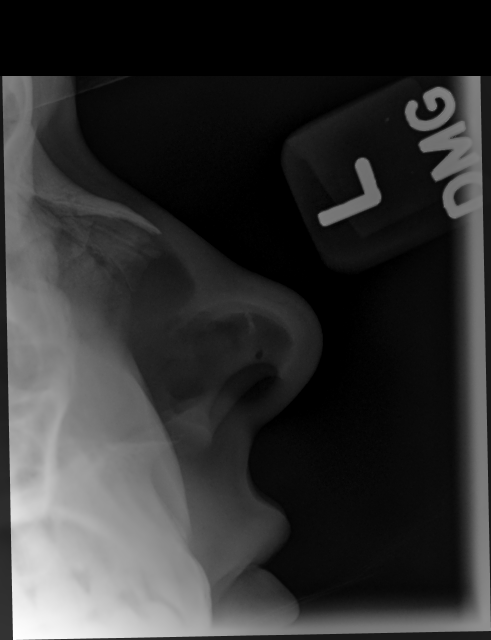

[sinuses lat (2 of 2)]
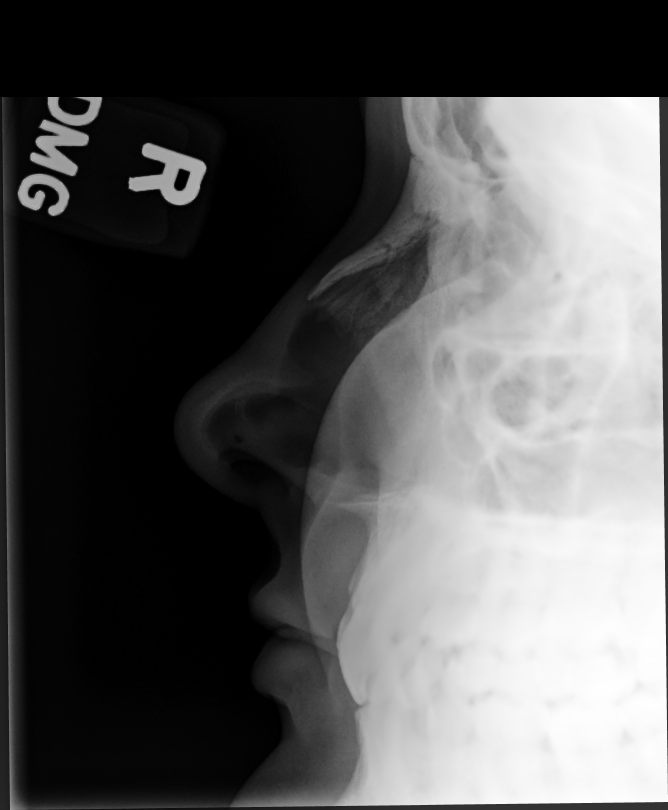

[3 of 3 positions shown; findings below may reference images not displayed]

FINDINGS: There is a nondisplaced fracture through the right nasal bone.
IMPRESSION: There is a nondisplaced fracture through the mid right nasal bone.
